# Patient Record
Sex: Female | Born: 1935 | Race: White | Hispanic: No | Smoking: Former smoker
Health system: Southern US, Community
[De-identification: ages and names within clinical notes are randomized; demographics above are authoritative.]

## PROBLEM LIST (undated history)

## (undated) DIAGNOSIS — I6529 Occlusion and stenosis of unspecified carotid artery: Secondary | ICD-10-CM

## (undated) DIAGNOSIS — A809 Acute poliomyelitis, unspecified: Secondary | ICD-10-CM

## (undated) DIAGNOSIS — I350 Nonrheumatic aortic (valve) stenosis: Secondary | ICD-10-CM

## (undated) DIAGNOSIS — E039 Hypothyroidism, unspecified: Secondary | ICD-10-CM

## (undated) DIAGNOSIS — E7800 Pure hypercholesterolemia, unspecified: Secondary | ICD-10-CM

## (undated) DIAGNOSIS — M1990 Unspecified osteoarthritis, unspecified site: Secondary | ICD-10-CM

## (undated) DIAGNOSIS — I2510 Atherosclerotic heart disease of native coronary artery without angina pectoris: Secondary | ICD-10-CM

## (undated) DIAGNOSIS — I8390 Asymptomatic varicose veins of unspecified lower extremity: Secondary | ICD-10-CM

## (undated) DIAGNOSIS — D519 Vitamin B12 deficiency anemia, unspecified: Secondary | ICD-10-CM

## (undated) DIAGNOSIS — M4800 Spinal stenosis, site unspecified: Secondary | ICD-10-CM

## (undated) DIAGNOSIS — K219 Gastro-esophageal reflux disease without esophagitis: Secondary | ICD-10-CM

## (undated) DIAGNOSIS — N183 Chronic kidney disease, stage 3 unspecified: Secondary | ICD-10-CM

## (undated) HISTORY — PX: THYROIDECTOMY: SHX17

## (undated) HISTORY — PX: DILATION AND CURETTAGE OF UTERUS: SHX78

## (undated) HISTORY — PX: JOINT REPLACEMENT: SHX530

## (undated) HISTORY — PX: SHOULDER DEBRIDEMENT: SHX1052

## (undated) HISTORY — PX: CATARACT EXTRACTION, BILATERAL: SHX1313

## (undated) HISTORY — PX: CARDIAC CATHETERIZATION: SHX172

## (undated) HISTORY — PX: COLONOSCOPY: SHX174

---
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
## 1981-07-30 HISTORY — PX: CHOLECYSTECTOMY: SHX55

## 1988-07-30 HISTORY — PX: KNEE ARTHROSCOPY: SUR90

## 1997-07-30 HISTORY — PX: TOTAL HIP ARTHROPLASTY: SHX124

## 1998-07-30 HISTORY — PX: TOTAL KNEE ARTHROPLASTY: SHX125

## 2002-07-30 HISTORY — PX: BUNIONECTOMY: SHX129

## 2003-07-31 DIAGNOSIS — I2510 Atherosclerotic heart disease of native coronary artery without angina pectoris: Secondary | ICD-10-CM

## 2003-07-31 DIAGNOSIS — I6529 Occlusion and stenosis of unspecified carotid artery: Secondary | ICD-10-CM

## 2003-07-31 HISTORY — DX: Occlusion and stenosis of unspecified carotid artery: I65.29

## 2003-07-31 HISTORY — DX: Atherosclerotic heart disease of native coronary artery without angina pectoris: I25.10

## 2006-07-30 DIAGNOSIS — M4800 Spinal stenosis, site unspecified: Secondary | ICD-10-CM

## 2006-07-30 HISTORY — DX: Spinal stenosis, site unspecified: M48.00

## 2007-10-29 HISTORY — PX: BELPHAROPTOSIS REPAIR: SHX369

## 2009-09-24 ENCOUNTER — Emergency Department (HOSPITAL_COMMUNITY): Admission: EM | Admit: 2009-09-24 | Discharge: 2009-09-24 | Payer: Self-pay | Admitting: Family Medicine

## 2009-09-27 ENCOUNTER — Inpatient Hospital Stay (HOSPITAL_COMMUNITY): Admission: EM | Admit: 2009-09-27 | Discharge: 2009-09-29 | Payer: Self-pay | Admitting: Emergency Medicine

## 2010-10-09 ENCOUNTER — Other Ambulatory Visit: Payer: Self-pay | Admitting: Family Medicine

## 2010-10-09 DIAGNOSIS — Z1231 Encounter for screening mammogram for malignant neoplasm of breast: Secondary | ICD-10-CM

## 2010-10-19 LAB — URINALYSIS, ROUTINE W REFLEX MICROSCOPIC
Glucose, UA: NEGATIVE mg/dL
Hgb urine dipstick: NEGATIVE
Nitrite: NEGATIVE
Protein, ur: NEGATIVE mg/dL

## 2010-10-19 LAB — CBC
HCT: 37.1 % (ref 36.0–46.0)
Hemoglobin: 12.7 g/dL (ref 12.0–15.0)
MCHC: 34.2 g/dL (ref 30.0–36.0)
MCV: 99.7 fL (ref 78.0–100.0)
Platelets: 146 10*3/uL — ABNORMAL LOW (ref 150–400)
RBC: 3.72 MIL/uL — ABNORMAL LOW (ref 3.87–5.11)
RDW: 13.4 % (ref 11.5–15.5)
WBC: 8 K/uL (ref 4.0–10.5)

## 2010-10-19 LAB — COMPREHENSIVE METABOLIC PANEL
ALT: 21 U/L (ref 0–35)
AST: 30 U/L (ref 0–37)
Albumin: 3.3 g/dL — ABNORMAL LOW (ref 3.5–5.2)
BUN: 14 mg/dL (ref 6–23)
Creatinine, Ser: 1.09 mg/dL (ref 0.4–1.2)
GFR calc Af Amer: 57 mL/min — ABNORMAL LOW (ref >60)
GFR calc non Af Amer: 47 mL/min — ABNORMAL LOW (ref >60)
Glucose, Bld: 92 mg/dL (ref 70–99)
Total Protein: 6.8 g/dL (ref 6.0–8.3)

## 2010-10-19 LAB — COMPREHENSIVE METABOLIC PANEL WITH GFR
Alkaline Phosphatase: 63 U/L (ref 39–117)
CO2: 26 meq/L (ref 19–32)
Calcium: 8.7 mg/dL (ref 8.4–10.5)
Chloride: 98 meq/L (ref 96–112)
Potassium: 3.2 meq/L — ABNORMAL LOW (ref 3.5–5.1)
Sodium: 135 meq/L (ref 135–145)
Total Bilirubin: 0.7 mg/dL (ref 0.3–1.2)

## 2010-10-19 LAB — DIFFERENTIAL
Basophils Absolute: 0 K/uL (ref 0.0–0.1)
Basophils Relative: 0 % (ref 0–1)
Eosinophils Absolute: 0 10*3/uL (ref 0.0–0.7)
Eosinophils Relative: 0 % (ref 0–5)
Lymphocytes Relative: 41 % (ref 12–46)
Lymphs Abs: 3.3 K/uL (ref 0.7–4.0)
Monocytes Absolute: 0.8 10*3/uL (ref 0.1–1.0)
Monocytes Relative: 10 % (ref 3–12)
Neutro Abs: 3.8 10*3/uL (ref 1.7–7.7)
Neutrophils Relative %: 48 % (ref 43–77)

## 2010-10-19 LAB — BRAIN NATRIURETIC PEPTIDE: Pro B Natriuretic peptide (BNP): 51.6 pg/mL (ref 0.0–100.0)

## 2010-10-19 LAB — URINE CULTURE
Colony Count: NO GROWTH
Special Requests: NEGATIVE

## 2010-10-19 LAB — EXPECTORATED SPUTUM ASSESSMENT W GRAM STAIN, RFLX TO RESP C

## 2010-10-19 LAB — TSH: TSH: 3.361 u[IU]/mL (ref 0.350–4.500)

## 2010-10-23 LAB — CULTURE, RESPIRATORY W GRAM STAIN: Gram Stain: NONE SEEN

## 2010-10-23 LAB — BASIC METABOLIC PANEL
CO2: 23 mEq/L (ref 19–32)
Chloride: 109 mEq/L (ref 96–112)
GFR calc Af Amer: >60 mL/min (ref >60)
Potassium: 4.2 mEq/L (ref 3.5–5.1)
Sodium: 136 mEq/L (ref 135–145)

## 2010-10-23 LAB — CBC
HCT: 35.5 % — ABNORMAL LOW (ref 36.0–46.0)
Hemoglobin: 11.9 g/dL — ABNORMAL LOW (ref 12.0–15.0)
MCHC: 33.6 g/dL (ref 30.0–36.0)
MCV: 100.8 fL — ABNORMAL HIGH (ref 78.0–100.0)

## 2010-11-16 ENCOUNTER — Ambulatory Visit
Admission: RE | Admit: 2010-11-16 | Discharge: 2010-11-16 | Disposition: A | Discharge status: Home / Self Care | Payer: Medicare Other | Source: Ambulatory Visit | Attending: Family Medicine | Admitting: Family Medicine

## 2010-11-16 ENCOUNTER — Ambulatory Visit: Payer: Self-pay

## 2010-11-16 DIAGNOSIS — Z1231 Encounter for screening mammogram for malignant neoplasm of breast: Secondary | ICD-10-CM

## 2011-05-28 ENCOUNTER — Encounter: Payer: Self-pay | Admitting: Cardiology

## 2011-07-01 ENCOUNTER — Emergency Department (HOSPITAL_COMMUNITY): Payer: Medicare Other

## 2011-07-01 ENCOUNTER — Inpatient Hospital Stay (HOSPITAL_COMMUNITY)
Admission: EM | Admit: 2011-07-01 | Discharge: 2011-07-04 | DRG: 282 | Disposition: A | Discharge status: Home / Self Care | Payer: Medicare Other | Attending: Cardiology | Admitting: Cardiology

## 2011-07-01 DIAGNOSIS — I2510 Atherosclerotic heart disease of native coronary artery without angina pectoris: Secondary | ICD-10-CM | POA: Diagnosis present

## 2011-07-01 DIAGNOSIS — M4800 Spinal stenosis, site unspecified: Secondary | ICD-10-CM | POA: Diagnosis present

## 2011-07-01 DIAGNOSIS — E039 Hypothyroidism, unspecified: Secondary | ICD-10-CM | POA: Diagnosis present

## 2011-07-01 DIAGNOSIS — Z7982 Long term (current) use of aspirin: Secondary | ICD-10-CM

## 2011-07-01 DIAGNOSIS — R7989 Other specified abnormal findings of blood chemistry: Secondary | ICD-10-CM

## 2011-07-01 DIAGNOSIS — Z96659 Presence of unspecified artificial knee joint: Secondary | ICD-10-CM

## 2011-07-01 DIAGNOSIS — Z886 Allergy status to analgesic agent status: Secondary | ICD-10-CM

## 2011-07-01 DIAGNOSIS — R002 Palpitations: Secondary | ICD-10-CM

## 2011-07-01 DIAGNOSIS — I10 Essential (primary) hypertension: Secondary | ICD-10-CM | POA: Diagnosis present

## 2011-07-01 DIAGNOSIS — I2582 Chronic total occlusion of coronary artery: Secondary | ICD-10-CM | POA: Diagnosis present

## 2011-07-01 DIAGNOSIS — R0602 Shortness of breath: Secondary | ICD-10-CM

## 2011-07-01 DIAGNOSIS — Z79899 Other long term (current) drug therapy: Secondary | ICD-10-CM

## 2011-07-01 DIAGNOSIS — E785 Hyperlipidemia, unspecified: Secondary | ICD-10-CM | POA: Diagnosis present

## 2011-07-01 DIAGNOSIS — I214 Non-ST elevation (NSTEMI) myocardial infarction: Secondary | ICD-10-CM | POA: Diagnosis not present

## 2011-07-01 DIAGNOSIS — R748 Abnormal levels of other serum enzymes: Secondary | ICD-10-CM

## 2011-07-01 DIAGNOSIS — Z8612 Personal history of poliomyelitis: Secondary | ICD-10-CM

## 2011-07-01 HISTORY — DX: Acute poliomyelitis, unspecified: A80.9

## 2011-07-01 HISTORY — DX: Hypothyroidism, unspecified: E03.9

## 2011-07-01 HISTORY — DX: Spinal stenosis, site unspecified: M48.00

## 2011-07-01 LAB — DIFFERENTIAL
Basophils Absolute: 0.1 10*3/uL (ref 0.0–0.1)
Basophils Relative: 1 % (ref 0–1)
Eosinophils Absolute: 0.2 10*3/uL (ref 0.0–0.7)
Eosinophils Relative: 2 % (ref 0–5)
Lymphocytes Relative: 35 % (ref 12–46)
Lymphs Abs: 3.1 10*3/uL (ref 0.7–4.0)
Monocytes Absolute: 1 10*3/uL (ref 0.1–1.0)

## 2011-07-01 LAB — URINALYSIS, ROUTINE W REFLEX MICROSCOPIC
Hgb urine dipstick: NEGATIVE
Leukocytes, UA: NEGATIVE
Nitrite: NEGATIVE
Urobilinogen, UA: 0.2 mg/dL (ref 0.0–1.0)

## 2011-07-01 LAB — CBC
HCT: 40.4 % (ref 36.0–46.0)
Hemoglobin: 13.3 g/dL (ref 12.0–15.0)
MCV: 103.6 fL — ABNORMAL HIGH (ref 78.0–100.0)
RBC: 3.9 MIL/uL (ref 3.87–5.11)
RDW: 12.5 % (ref 11.5–15.5)
WBC: 8.8 10*3/uL (ref 4.0–10.5)

## 2011-07-01 LAB — BASIC METABOLIC PANEL
CO2: 27 mEq/L (ref 19–32)
Calcium: 10 mg/dL (ref 8.4–10.5)
Creatinine, Ser: 0.77 mg/dL (ref 0.50–1.10)

## 2011-07-01 LAB — TROPONIN I: Troponin I: 2.91 ng/mL (ref <0.30)

## 2011-07-01 MED ORDER — SODIUM CHLORIDE 0.9 % IV SOLN
Freq: Once | INTRAVENOUS | Status: AC
  Administered 2011-07-01: 23:00:00 via INTRAVENOUS

## 2011-07-01 MED ORDER — HEPARIN (PORCINE) IN NACL 100-0.45 UNIT/ML-% IJ SOLN
12.0000 [IU]/kg/h | INTRAMUSCULAR | Status: DC
  Administered 2011-07-01: 12 [IU]/kg/h via INTRAVENOUS
  Filled 2011-07-01 (×2): qty 250

## 2011-07-01 MED ORDER — ASPIRIN 81 MG PO CHEW
324.0000 mg | CHEWABLE_TABLET | Freq: Once | ORAL | Status: AC
  Administered 2011-07-01: 324 mg via ORAL
  Filled 2011-07-01: qty 4

## 2011-07-01 MED ORDER — NITROGLYCERIN 2 % TD OINT
1.0000 [in_us] | TOPICAL_OINTMENT | Freq: Once | TRANSDERMAL | Status: AC
  Administered 2011-07-01: 1 [in_us] via TOPICAL
  Filled 2011-07-01: qty 30

## 2011-07-01 MED ORDER — HEPARIN BOLUS VIA INFUSION
4000.0000 [IU] | Freq: Once | INTRAVENOUS | Status: AC
  Administered 2011-07-01: 4000 [IU] via INTRAVENOUS
  Filled 2011-07-01: qty 4000

## 2011-07-01 NOTE — ED Notes (Signed)
Pt reports being very sad this time of year and she got to thinking about her husband and stated, "I felt a little pain and flutter in my chest" NSR on monitor with PACs. Rate 76. Pt is alert and oriented, good perfusion.

## 2011-07-01 NOTE — ED Notes (Signed)
EDP Lynelle Doctor made aware of critical troponin 2.91- no orders given

## 2011-07-01 NOTE — ED Notes (Signed)
Pt in from home with stated palpations dizziness and weakness denies pain/nausea states acute onset was told by ems to come in

## 2011-07-01 NOTE — ED Notes (Signed)
Patient transported to X-ray 

## 2011-07-01 NOTE — ED Provider Notes (Addendum)
History     CSN: [id number] Arrival date & time: 07/01/2011  6:32 PM   First MD Initiated Contact with Patient 07/01/11 1907      Chief Complaint  Patient presents with  . Irregular Heart Beat    HPI Pt this afternoon felt lightheaded as if she was faint.  Pt felt like her heart was racing.  She had been emotionally stressed since her husband passed away this time of year.  Over the past few weeks she has been not exercising as much.  She has been feeling slightly tired and sluggish.  She has not had any chest pain, abdominal pain, vomiting or diarrhea.  No fevers or cough.  She did take her toprol medication since she has been here.  The symptoms have resolved and now she feels fine. History reviewed. No pertinent past medical history.  Past Surgical History  Procedure Date  . Joint replacement   . Shoulder debridement   . Tonsillectomy   . Appendectomy     History reviewed. No pertinent family history.  History  Substance Use Topics  . Smoking status: Never Smoker   . Smokeless tobacco: Not on file  . Alcohol Use: No    OB History    Grav Para Term Preterm Abortions TAB SAB Ect Mult Living                  Review of Systems  All other systems reviewed and are negative.    Allergies  Review of patient's allergies indicates no known allergies.  Home Medications  No current outpatient prescriptions on file.  BP 126/72  Pulse 78  Temp(Src) 98.8 F (37.1 C) (Oral)  Resp 20  SpO2 99%  Physical Exam  Nursing note and vitals reviewed. Constitutional: She appears well-developed and well-nourished. No distress.  HENT:  Head: Normocephalic and atraumatic.  Right Ear: External ear normal.  Left Ear: External ear normal.  Eyes: Conjunctivae are normal. Right eye exhibits no discharge. Left eye exhibits no discharge. No scleral icterus.  Neck: Neck supple. No tracheal deviation present.  Cardiovascular: Normal rate, regular rhythm and intact distal pulses.     Pulmonary/Chest: Effort normal and breath sounds normal. No stridor. No respiratory distress. She has no wheezes. She has no rales.  Abdominal: Soft. Bowel sounds are normal. She exhibits no distension. There is no tenderness. There is no rebound and no guarding.  Musculoskeletal: She exhibits no edema and no tenderness.  Neurological: She is alert. She has normal strength. No sensory deficit. Cranial nerve deficit:  no gross defecits noted. She exhibits normal muscle tone. She displays no seizure activity. Coordination normal.       5/5 strength upper and lower extremity, nl speech  Skin: Skin is warm and dry. No rash noted.  Psychiatric: She has a normal mood and affect.    ED Course  Procedures (including critical care time)  Date: 07/01/2011  Rate: 84  Rhythm: normal sinus rhythm  QRS Axis: normal  Intervals: normal  ST/T Wave abnormalities: normal  Conduction Disutrbances:none  Narrative Interpretation:   Old EKG Reviewed: none available   Medications  vitamin B-12 (CYANOCOBALAMIN) 1000 MCG tablet (not administered)  acetaminophen (TYLENOL) 650 MG CR tablet (not administered)  furosemide (LASIX) 20 MG tablet (not administered)  multivitamin-iron-minerals-folic acid (CENTRUM) chewable tablet (not administered)  aspirin 81 MG chewable tablet (not administered)  famotidine (PEPCID) 20 MG tablet (not administered)  fexofenadine (ALLEGRA) 60 MG tablet (not administered)  gelatin 650 MG capsule (not  administered)  B Complex-C (B-COMPLEX WITH VITAMIN C) tablet (not administered)  cholecalciferol (VITAMIN D) 1000 UNITS tablet (not administered)  vitamin E 100 UNIT capsule (not administered)  metoprolol (TOPROL-XL) 50 MG 24 hr tablet (not administered)  benazepril (LOTENSIN) 10 MG tablet (not administered)  potassium chloride (KLOR-CON) 10 MEQ CR tablet (not administered)  levothyroxine (SYNTHROID, LEVOTHROID) 88 MCG tablet (not administered)  Misc Natural Products (OSTEO BI-FLEX  JOINT SHIELD) TABS (not administered)  aspirin chewable tablet 324 mg (not administered)  nitroGLYCERIN (NITROGLYN) 2 % ointment 1 inch (not administered)    Labs Reviewed  CBC - Abnormal; Notable for the following:    MCV 103.6 (*)    MCH 34.1 (*)    All other components within normal limits  BASIC METABOLIC PANEL - Abnormal; Notable for the following:    GFR calc non Af Amer 72 (*)    GFR calc Af Amer 83 (*)    All other components within normal limits  TROPONIN I - Abnormal; Notable for the following:    Troponin I 2.91 (*)    All other components within normal limits  DIFFERENTIAL  URINALYSIS, ROUTINE W REFLEX MICROSCOPIC   Dg Chest 2 View  07/01/2011  *RADIOLOGY REPORT*  Clinical Data: Palpitations and shortness of breath.  CHEST - 2 VIEW  Comparison: Chest x-ray 09/26/2009.  Findings: The heart is borderline enlarged but stable.  There is tortuosity and calcification of the thoracic aorta.  The lungs are clear.  No pleural effusion.  No pneumothorax.  The bony thorax is intact.  IMPRESSION: No acute cardiopulmonary findings.  Original Report Authenticated By: P. Loralie Champagne, M.D.    Diagnoses: Weakness, elevated cardiac enzymes, palpitations  MDM  Patient had an episode of palpitations associated with weakness and dizziness. Patient denied any chest pain or shortness of breath. Her EKG is unremarkable however she does have an elevated troponin level. This is certainly an atypical presentation but certainly could have been a manifestation of angina. I will consult cardiology for admission and further evaluation for possible nstemi   Celene Kras, MD 07/01/11 2125  Celene Kras, MD 07/01/11 2340

## 2011-07-01 NOTE — H&P (Signed)
Cardiology H&P  Primary Care Povider: Dr. Laurine Blazer [facility] Primary Cardiologist: previously Dr. Avel Sensor in [place]  HPI: Ms. Bannister is a 77 y.o.female with known CAD and LHC in 2005 with occluded RCA per her report who is brought to the ED by her granddaughter due to DOE.  She is normally very active and lives with minimal assistance in an assisted living apartment.  She was walking from her apartment to the garage and became very SOB.  She also felt palpitations and light headedness but did not report any chest discomfort.  Her granddaughter called EMS.  She is now feeling better.  She reports having been emotional for the last few days due to the anniversary of her husbands death.  She has never had symptoms similar to today.  Currently she is chest pain free.   Past Medical History  Diagnosis Date  . Essential hypertension   . Coronary atherosclerosis 2005    Followed by Dr. Avel Sensor in [place], [place] previously.  Had LHC with occluded RCA in 2005 per patient report  . Pneumonia 2011  . Heart murmur   . Spinal stenosis 2008  . Polio     Past Surgical History  Procedure Date  . Joint replacement   . Shoulder debridement   . Tonsillectomy   . Appendectomy   . Cholecystectomy   . Total knee arthroplasty     Family History  Problem Relation Age of Onset  . Coronary artery disease Father   . Coronary artery disease Brother   . Coronary artery disease Brother     Social History:  reports that she quit smoking about 48 years ago. Her smoking use included Cigarettes. She has never used smokeless tobacco. She reports that she drinks about 7 ounces of alcohol per week. She reports that she does not use illicit drugs.  Allergies:  Allergies  Allergen Reactions  . Codeine Other (See Comments)    UNKNOWN    Current Facility-Administered Medications  Medication Dose Route Frequency Provider Last Rate Last Dose  . aspirin chewable tablet 324 mg  324 mg  Oral Once Celene Kras, MD      . heparin 100 units/mL bolus via infusion 4,000 Units  4,000 Units Intravenous Once Celene Kras, MD      . heparin ADULT infusion 100 units/mL (25000 units/250 mL)  12 Units/kg/hr Intravenous Continuous Celene Kras, MD      . nitroGLYCERIN (NITROGLYN) 2 % ointment 1 inch  1 inch Topical Once Celene Kras, MD       Current Outpatient Prescriptions  Medication Sig Dispense Refill  . acetaminophen (TYLENOL) 650 MG CR tablet Take 1,300 mg by mouth 2 (two) times daily as needed. PAIN        . aspirin 81 MG chewable tablet Chew 81 mg by mouth daily.        . B Complex-C (B-COMPLEX WITH VITAMIN C) tablet Take 1 tablet by mouth daily.        . benazepril (LOTENSIN) 10 MG tablet Take 10 mg by mouth daily.        . cholecalciferol (VITAMIN D) 1000 UNITS tablet Take 1,000 Units by mouth daily.        . famotidine (PEPCID) 20 MG tablet Take 20 mg by mouth 2 (two) times daily.        . fexofenadine (ALLEGRA) 60 MG tablet Take 60 mg by mouth 2 (two) times daily.        Marland Kitchen  furosemide (LASIX) 20 MG tablet Take 20 mg by mouth 2 (two) times daily.        Marland Kitchen gelatin 650 MG capsule Take 650 mg by mouth daily.        Marland Kitchen levothyroxine (SYNTHROID, LEVOTHROID) 88 MCG tablet Take 88 mcg by mouth daily.        . metoprolol (TOPROL-XL) 50 MG 24 hr tablet Take 50 mg by mouth daily.        . Misc Natural Products (OSTEO BI-FLEX JOINT SHIELD) TABS Take 1 tablet by mouth daily.        . multivitamin-iron-minerals-folic acid (CENTRUM) chewable tablet Chew 1 tablet by mouth daily.        . potassium chloride (KLOR-CON) 10 MEQ CR tablet Take 10-20 mEq by mouth daily.        . vitamin B-12 (CYANOCOBALAMIN) 1000 MCG tablet Take 1,000 mcg by mouth daily.        . vitamin E 100 UNIT capsule Take 100 Units by mouth daily.          ROS: A full review of systems is obtained and is negative except as noted in the HPI.  Physical Exam: Blood pressure 126/72, pulse 78, temperature 98.8 F (37.1 C),  temperature source Oral, resp. rate 20, SpO2 99.00%.  GENERAL: no acute distress.  EYES: Extra ocular movements are intact. There is no lid lag. Sclera is anicteric.  ENT: Oropharynx is clear. Dentition is within normal limits.  NECK: Supple. The thyroid is not enlarged.  LYMPH: There are no masses or lymphadenopathy present.  HEART: Regular rate and rhythm with 2/6 SEM that increased with valvsalva, no JVD LUNGS: Clear to auscultation There are no rales, rhonchi, or wheezes.  ABDOMEN: Soft, non-tender, and non-distended with normoactive bowel sounds. There is no hepatosplenomegaly.  EXTREMITIES: No clubbing, cyanosis, minmal pedal edema PULSES:  DP/PT pulses were +2 and equal bilaterally.  SKIN: Warm, dry, and intact.  NEUROLOGIC: The patient was oriented to person, place, and time. No overt neurologic deficits were detected.  PSYCH: Normal judgment and insight, mood is appropriate.   Results: Lab Results  Component Value Date   WBC 8.8 07/01/2011   HGB 13.3 07/01/2011   HCT 40.4 07/01/2011   MCV 103.6* 07/01/2011   PLT 236 07/01/2011    Lab Results  Component Value Date   CREATININE 0.77 07/01/2011   BUN 14 07/01/2011   NA 137 07/01/2011   K 4.5 07/01/2011   CL 101 07/01/2011   CO2 27 07/01/2011    Lab Results  Component Value Date   TROPONINI 2.91* 07/01/2011    CXR: clear with no acute findings  EKG: NSR without STT changes  Assessment/Plan: 77 yo WF with HTN and known CAD here with exertional SOB and elevated troponin.  Diagnosis likely ACS/NSTEMI but would also consider stress cardiomyopathy/takotsubo given her recent emotional stress. She is interested in invasive approach if that is what is needed but she does not want surgery or to undergo general anesthesia under any circumstance 1. NSTEMI: - ASA 325 - heparin ggt - continue toprol xl, start statin - will consider invasive vs conservative approach  2. Murmur: suspect mild AS vs outflow tract obstruction, preserved  2nd heart sound. - check echo in AM   Satine Hausner 07/01/2011, 10:35 PM

## 2011-07-02 ENCOUNTER — Encounter (HOSPITAL_COMMUNITY): Payer: Self-pay | Admitting: *Deleted

## 2011-07-02 ENCOUNTER — Encounter (HOSPITAL_COMMUNITY)
Admission: EM | Disposition: A | Discharge status: Home / Self Care | Payer: Self-pay | Source: Home / Self Care | Attending: Internal Medicine

## 2011-07-02 DIAGNOSIS — I214 Non-ST elevation (NSTEMI) myocardial infarction: Principal | ICD-10-CM

## 2011-07-02 HISTORY — PX: LEFT HEART CATHETERIZATION WITH CORONARY ANGIOGRAM: SHX5451

## 2011-07-02 LAB — BASIC METABOLIC PANEL
BUN: 11 mg/dL (ref 6–23)
CO2: 26 mEq/L (ref 19–32)
Chloride: 104 mEq/L (ref 96–112)
Creatinine, Ser: 0.71 mg/dL (ref 0.50–1.10)

## 2011-07-02 LAB — CBC
HCT: 36.4 % (ref 36.0–46.0)
HCT: 35.6 % — ABNORMAL LOW (ref 36.0–46.0)
MCHC: 31.9 g/dL (ref 30.0–36.0)
MCV: 103.5 fL — ABNORMAL HIGH (ref 78.0–100.0)
Platelets: 211 10*3/uL (ref 150–400)
RBC: 3.44 MIL/uL — ABNORMAL LOW (ref 3.87–5.11)
RDW: 12.5 % (ref 11.5–15.5)
WBC: 6.8 10*3/uL (ref 4.0–10.5)
WBC: 6.3 10*3/uL (ref 4.0–10.5)

## 2011-07-02 LAB — LIPID PANEL
HDL: 59 mg/dL (ref >39)
LDL Cholesterol: 102 mg/dL — ABNORMAL HIGH (ref 0–99)
Triglycerides: 93 mg/dL (ref <150)
VLDL: 19 mg/dL (ref 0–40)

## 2011-07-02 LAB — COMPREHENSIVE METABOLIC PANEL
ALT: 12 U/L (ref 0–35)
Albumin: 3.3 g/dL — ABNORMAL LOW (ref 3.5–5.2)
Alkaline Phosphatase: 80 U/L (ref 39–117)
BUN: 12 mg/dL (ref 6–23)
Chloride: 106 mEq/L (ref 96–112)
GFR calc Af Amer: 85 mL/min — ABNORMAL LOW (ref >90)
Glucose, Bld: 99 mg/dL (ref 70–99)
Potassium: 3.6 mEq/L (ref 3.5–5.1)
Sodium: 141 mEq/L (ref 135–145)
Total Bilirubin: 0.7 mg/dL (ref 0.3–1.2)

## 2011-07-02 LAB — CARDIAC PANEL(CRET KIN+CKTOT+MB+TROPI)
CK, MB: 11.9 ng/mL (ref 0.3–4.0)
CK, MB: 9.6 ng/mL (ref 0.3–4.0)
Relative Index: 8.7 — ABNORMAL HIGH (ref 0.0–2.5)
Total CK: 127 U/L (ref 7–177)
Troponin I: 2.92 ng/mL (ref <0.30)
Troponin I: 2.3 ng/mL (ref <0.30)

## 2011-07-02 LAB — HEPARIN LEVEL (UNFRACTIONATED): Heparin Unfractionated: 0.46 IU/mL (ref 0.30–0.70)

## 2011-07-02 LAB — HEMOGLOBIN A1C: Hgb A1c MFr Bld: 6 % — ABNORMAL HIGH (ref <5.7)

## 2011-07-02 LAB — MRSA PCR SCREENING: MRSA by PCR: NEGATIVE

## 2011-07-02 SURGERY — LEFT HEART CATHETERIZATION WITH CORONARY ANGIOGRAM
Anesthesia: LOCAL

## 2011-07-02 MED ORDER — SODIUM CHLORIDE 0.9 % IV SOLN
INTRAVENOUS | Status: DC
  Administered 2011-07-02: 04:00:00 via INTRAVENOUS

## 2011-07-02 MED ORDER — LORATADINE 10 MG PO TABS
10.0000 mg | ORAL_TABLET | Freq: Every day | ORAL | Status: DC
  Administered 2011-07-02 – 2011-07-04 (×3): 10 mg via ORAL
  Filled 2011-07-02 (×3): qty 1

## 2011-07-02 MED ORDER — FAMOTIDINE 20 MG PO TABS
20.0000 mg | ORAL_TABLET | Freq: Two times a day (BID) | ORAL | Status: DC
  Administered 2011-07-02 – 2011-07-04 (×5): 20 mg via ORAL
  Filled 2011-07-02 (×6): qty 1

## 2011-07-02 MED ORDER — CENTRUM PO CHEW: 1.0000 | CHEWABLE_TABLET | Freq: Every day | ORAL | Status: DC

## 2011-07-02 MED ORDER — ACETAMINOPHEN 325 MG PO TABS: 650.0000 mg | ORAL_TABLET | ORAL | Status: DC | PRN

## 2011-07-02 MED ORDER — SODIUM CHLORIDE 0.9 % IV SOLN
INTRAVENOUS | Status: DC
  Administered 2011-07-02 – 2011-07-03 (×3): via INTRAVENOUS

## 2011-07-02 MED ORDER — MIDAZOLAM HCL 2 MG/2ML IJ SOLN
INTRAMUSCULAR | Status: AC
  Filled 2011-07-02: qty 2

## 2011-07-02 MED ORDER — HEPARIN (PORCINE) IN NACL 2-0.9 UNIT/ML-% IJ SOLN
INTRAMUSCULAR | Status: AC
  Filled 2011-07-02: qty 2000

## 2011-07-02 MED ORDER — VITAMIN D3 25 MCG (1000 UNIT) PO TABS
1000.0000 [IU] | ORAL_TABLET | Freq: Every day | ORAL | Status: DC
  Administered 2011-07-02 – 2011-07-04 (×3): 1000 [IU] via ORAL
  Filled 2011-07-02 (×3): qty 1

## 2011-07-02 MED ORDER — BENAZEPRIL HCL 10 MG PO TABS
10.0000 mg | ORAL_TABLET | Freq: Every day | ORAL | Status: DC
  Administered 2011-07-02 – 2011-07-03 (×2): 10 mg via ORAL
  Filled 2011-07-02 (×3): qty 1

## 2011-07-02 MED ORDER — ASPIRIN 81 MG PO CHEW
324.0000 mg | CHEWABLE_TABLET | ORAL | Status: DC
  Filled 2011-07-02: qty 4

## 2011-07-02 MED ORDER — LEVOTHYROXINE SODIUM 88 MCG PO TABS
88.0000 ug | ORAL_TABLET | Freq: Every day | ORAL | Status: DC
  Administered 2011-07-02 – 2011-07-04 (×3): 88 ug via ORAL
  Filled 2011-07-02 (×4): qty 1

## 2011-07-02 MED ORDER — THERA M PLUS PO TABS
1.0000 | ORAL_TABLET | Freq: Every day | ORAL | Status: DC
  Administered 2011-07-02 – 2011-07-04 (×3): 1 via ORAL
  Filled 2011-07-02 (×3): qty 1

## 2011-07-02 MED ORDER — DIAZEPAM 2 MG PO TABS
2.0000 mg | ORAL_TABLET | ORAL | Status: DC
  Filled 2011-07-02: qty 1

## 2011-07-02 MED ORDER — ONDANSETRON HCL 4 MG/2ML IJ SOLN: 4.0000 mg | Freq: Four times a day (QID) | INTRAMUSCULAR | Status: DC | PRN

## 2011-07-02 MED ORDER — B COMPLEX-C PO TABS
1.0000 | ORAL_TABLET | Freq: Every day | ORAL | Status: DC
  Administered 2011-07-02 – 2011-07-04 (×3): 1 via ORAL
  Filled 2011-07-02 (×3): qty 1

## 2011-07-02 MED ORDER — NITROGLYCERIN 0.2 MG/ML ON CALL CATH LAB
INTRAVENOUS | Status: AC
  Filled 2011-07-02: qty 1

## 2011-07-02 MED ORDER — SODIUM CHLORIDE 0.9 % IJ SOLN: 3.0000 mL | INTRAMUSCULAR | Status: DC | PRN

## 2011-07-02 MED ORDER — DIAZEPAM 2 MG PO TABS
2.0000 mg | ORAL_TABLET | ORAL | Status: AC
  Administered 2011-07-02: 2 mg via ORAL

## 2011-07-02 MED ORDER — VITAMIN E 45 MG (100 UNIT) PO CAPS
100.0000 [IU] | ORAL_CAPSULE | Freq: Every day | ORAL | Status: DC
  Administered 2011-07-02 – 2011-07-04 (×3): 100 [IU] via ORAL
  Filled 2011-07-02 (×3): qty 1

## 2011-07-02 MED ORDER — SODIUM CHLORIDE 0.9 % IJ SOLN
3.0000 mL | Freq: Two times a day (BID) | INTRAMUSCULAR | Status: DC
  Administered 2011-07-02: 3 mL via INTRAVENOUS

## 2011-07-02 MED ORDER — ASPIRIN 81 MG PO CHEW
324.0000 mg | CHEWABLE_TABLET | ORAL | Status: AC
  Administered 2011-07-02: 324 mg via ORAL

## 2011-07-02 MED ORDER — SODIUM CHLORIDE 0.9 % IV SOLN: 250.0000 mL | INTRAVENOUS | Status: DC | PRN

## 2011-07-02 MED ORDER — NITROGLYCERIN 0.4 MG SL SUBL: 0.4000 mg | SUBLINGUAL_TABLET | SUBLINGUAL | Status: DC | PRN

## 2011-07-02 MED ORDER — SODIUM CHLORIDE 0.9 % IV SOLN: 1.0000 mL/kg/h | INTRAVENOUS | Status: DC

## 2011-07-02 MED ORDER — FUROSEMIDE 20 MG PO TABS
20.0000 mg | ORAL_TABLET | Freq: Two times a day (BID) | ORAL | Status: DC
  Administered 2011-07-02: 20 mg via ORAL
  Filled 2011-07-02 (×3): qty 1

## 2011-07-02 MED ORDER — METOPROLOL SUCCINATE ER 50 MG PO TB24
50.0000 mg | ORAL_TABLET | Freq: Every day | ORAL | Status: DC
  Administered 2011-07-02 – 2011-07-04 (×3): 50 mg via ORAL
  Filled 2011-07-02 (×3): qty 1

## 2011-07-02 MED ORDER — HEPARIN SOD (PORCINE) IN D5W 100 UNIT/ML IV SOLN
1000.0000 [IU]/h | INTRAVENOUS | Status: DC
  Administered 2011-07-02: 1000 [IU]/h via INTRAVENOUS
  Filled 2011-07-02 (×3): qty 250

## 2011-07-02 MED ORDER — ASPIRIN EC 325 MG PO TBEC
325.0000 mg | DELAYED_RELEASE_TABLET | Freq: Every day | ORAL | Status: DC
  Administered 2011-07-02 – 2011-07-04 (×3): 325 mg via ORAL
  Filled 2011-07-02 (×3): qty 1

## 2011-07-02 MED ORDER — LIDOCAINE HCL (PF) 1 % IJ SOLN
INTRAMUSCULAR | Status: AC
  Filled 2011-07-02: qty 30

## 2011-07-02 MED ORDER — SIMVASTATIN 20 MG PO TABS
20.0000 mg | ORAL_TABLET | Freq: Every day | ORAL | Status: DC
  Administered 2011-07-02 – 2011-07-03 (×2): 20 mg via ORAL
  Filled 2011-07-02 (×3): qty 1

## 2011-07-02 NOTE — Interval H&P Note (Signed)
History and Physical Interval Note:  07/02/2011 1:53 PM  Shristi Scheib  has presented today for surgery, with the diagnosis of NSTEMI  The various methods of treatment have been discussed with the patient and family. After consideration of risks, benefits and other options for treatment, the patient has consented to  Procedure(s): LEFT HEART CATHETERIZATION WITH CORONARY ANGIOGRAM as a surgical intervention .  The patients' history has been reviewed, patient examined, no change in status, stable for surgery.  I have reviewed the patients' chart and labs.  Questions were answered to the patient's satisfaction.     Rollene Rotunda

## 2011-07-02 NOTE — H&P (View-Only) (Signed)
@   Subjective:  Denies CP or dyspnea   Objective:  Filed Vitals:   07/02/11 0400 07/02/11 0600 07/02/11 0700 07/02/11 0800  BP:  116/80 126/65 121/68  Pulse: 72 66 61 61  Temp: 98.2 F (36.8 C)     TempSrc: Oral     Resp:  15 16 18  Height:      Weight:      SpO2: 99% 97% 97% 98%    Intake/Output from previous day:  Intake/Output Summary (Last 24 hours) at 07/02/11 0942 Last data filed at 07/02/11 0800  Gross per 24 hour  Intake    192 ml  Output    350 ml  Net   -158 ml    Physical Exam: Physical exam: Well-developed well-nourished in no acute distress.  Skin is warm and dry.  HEENT is normal.  Neck is supple. No thyromegaly.  Chest is clear to auscultation with normal expansion.  Cardiovascular exam is regular rate and rhythm. 2/6 systolic murmur Abdominal exam nontender or distended. No masses palpated. Extremities show no edema. neuro grossly intact    Lab Results: Basic Metabolic Panel:  Basename 07/02/11 0720 07/01/11 2022  NA 141 137  K 3.6 4.5  CL 106 101  CO2 26 27  GLUCOSE 99 96  BUN 12 14  CREATININE 0.71 0.77  CALCIUM 9.1 10.0  MG -- --  PHOS -- --   Liver Function Tests:  Basename 07/02/11 0720  AST 30  ALT 12  ALKPHOS 80  BILITOT 0.7  PROT 6.3  ALBUMIN 3.3*   No results found for this basename: LIPASE:2,AMYLASE:2 in the last 72 hours CBC:  Basename 07/02/11 0720 07/01/11 2022  WBC 6.8 8.8  NEUTROABS -- 4.5  HGB 11.6* 13.3  HCT 36.4 40.4  MCV 103.4* 103.6*  PLT 219 236   Cardiac Enzymes:  Basename 07/02/11 0720 07/01/11 2022  CKTOTAL 137 --  CKMB 11.9* --  CKMBINDEX -- --  TROPONINI 2.92* 2.91*   Fasting Lipid Panel:  Basename 07/02/11 0720  CHOL 180  HDL 59  LDLCALC 102*  TRIG 93  CHOLHDL 3.1  LDLDIRECT --      Assessment/Plan:  1) NSTEMI - Patient very functional; plan cath/possible PCI. Risks and benefits discussed and patient agrees to proceed. Continue present meds; hold lasix 2) murmur - outpatient  echo. 3) HTN - continue present BP meds 4) Hyperlipidemia - Continue statin. 5) H/O CAD Brian Crenshaw 07/02/2011, 9:42 AM    

## 2011-07-02 NOTE — Progress Notes (Addendum)
ANTICOAGULATION CONSULT NOTE - Follow Up Consult  Pharmacy Consult for Hepairn Indication: chest pain/ACS  Allergies  Allergen Reactions  . Codeine Other (See Comments)    UNKNOWN    Patient Measurements: Height: 5' (152.4 cm) Weight: 188 lb 0.8 oz (85.3 kg) IBW/kg (Calculated) : 45.5  Adjusted Body Weight:   Vital Signs: Temp: 98.2 F (36.8 C) (12/03 1200) Temp src: Oral (12/03 1200) BP: 140/92 mmHg (12/03 1200) Pulse Rate: 63  (12/03 1000)  Labs:  Basename 07/02/11 1230 07/02/11 1147 07/02/11 0720 07/01/11 2022  HGB -- 11.3* 11.6* --  HCT -- 35.6* 36.4 40.4  PLT -- 211 219 236  APTT -- -- 111* --  LABPROT -- -- 14.4 --  INR -- -- 1.10 --  HEPARINUNFRC -- 0.46 -- --  CREATININE -- 0.71 0.71 0.77  CKTOTAL 127 -- 137 --  CKMB 9.6* -- 11.9* --  TROPONINI 2.30* -- 2.92* 2.91*   Estimated Creatinine Clearance: 45.3 ml/min (by C-G formula based on Cr of 0.71).   Medications:  Infusions:    . sodium chloride 20 mL/hr at 07/02/11 0412  . sodium chloride    . heparin 12 Units/kg/hr (07/01/11 2303)    Assessment: 89 yof admitted with chest pain currently at goal on IV heparin (0.4). No complications noted, plan for cath today. Goal of Therapy:  Heparin level 0.3-0.7 units/ml   Plan:  Continue heparin at current rate Follow up after cath.  Severiano Gilbert 07/02/2011,1:49 PM   P.m. Addendum  Patient is now s/p cath. There is concern for possible R groin hematoma, will restart heparin at reduced rate.   There has been some confusion regarding heparin dose. Rate was never decreased to 1000 units/hr overnight, per RN heparin was running at 1200 units/hr (9ml/hr) prior to cath. Plan: 1.F/u d-dimer/ruling out PE 2.Restart heparin tonight without bolus 3.Heparin level with am labs  Sheppard Coil PharmD

## 2011-07-02 NOTE — Procedures (Signed)
  Cardiac Catheterization Procedure Note  Name: Holly Gibson MRN: [id number] DOB: [birth year]  Procedure: Left Heart Cath, Selective Coronary Angiography, LV angiography  Indication:  NQWMI  Procedural details: The right groin was prepped, draped, and anesthetized with 1% lidocaine. Using modified Seldinger technique, a 5 French sheath was introduced into the right femoral artery. Standard Judkins catheters were used for coronary angiography and left ventriculography. Catheter exchanges were performed over a guidewire. There were no immediate procedural complications. The patient was transferred to the post catheterization recovery area for further monitoring.  Procedural Findings:  Hemodynamics:     AO 128/57     LV  157/8   Coronary angiography:  Coronary dominance: Right  Left mainstem:   Luminal irregulars  Left anterior descending (LAD):   Proximal and mid severe calcification.  Long proximal 25% stenosis. First diagonal very small normal. Second diagonal moderate size normal. There diagonal moderate size normal.  Left circumflex (LCx):  AV groove proximal moderate calcification with proximal luminal irregularities. There is a very large branching obtuse marginal with mild luminal irregularities.  Right coronary artery (RCA):  Right coronary is dominant. There is severe calcification. It is occluded in the midsegment. There is brisk collateralization predominantly from the LAD but also slightly from the circumflex.  Left ventriculography: Left ventricular systolic function is normal, LVEF is estimated at 65%, there is no significant mitral regurgitation.  There was an aortic valvular gradient noted. There was significant mitral annular calcification.  Final Conclusions:  High-grade single-vessel obstructive coronary disease which has been reported previously. Preserved ejection fraction. There is a peak to peak aortic gradient.  Recommendations: The patient will have a d-dimer.  Consideration will be given to further imaging to rule out pulmonary emboli if this is elevated. She will have an outpatient echocardiogram to evaluate the degree of aortic stenosis.  Rollene Rotunda 07/02/2011, 2:23 PM

## 2011-07-02 NOTE — Progress Notes (Signed)
TO THE CATH LAB AWAKE AND ALERT, STABLE.Marland Kitchen REPORT GIVEN.

## 2011-07-02 NOTE — Progress Notes (Signed)
ANTICOAGULATION CONSULT NOTE - Initial Consult  Pharmacy Consult for Heparin  Indication: NSTEMI  Allergies  Allergen Reactions  . Codeine Other (See Comments)    UNKNOWN    Patient Measurements: Height: 5' (152.4 cm) Weight: 188 lb 0.8 oz (85.3 kg) IBW/kg (Calculated) : 45.5    Vital Signs: Temp: 98.3 F (36.8 C) (12/03 0215) Temp src: Oral (12/03 0215) BP: 118/72 mmHg (12/03 0215) Pulse Rate: 68  (12/03 0215)  Labs:  [facility] 07/01/11 2022  HGB 13.3  HCT 40.4  PLT 236  APTT --  LABPROT --  INR --  HEPARINUNFRC --  CREATININE 0.77  CKTOTAL --  CKMB --  TROPONINI 2.91*   Estimated Creatinine Clearance: 45.3 ml/min (by C-G formula based on Cr of 0.77).  Medical History: Past Medical History  Diagnosis Date  . Essential hypertension   . Coronary atherosclerosis 2005    Followed by Dr. Avel Sensor in [place], [place] previously.  Had LHC with occluded RCA in 2005 per patient report  . Pneumonia 2011  . Heart murmur   . Spinal stenosis 2008  . Polio     Medications:  Prescriptions prior to admission  Medication Sig Dispense Refill  . acetaminophen (TYLENOL) 650 MG CR tablet Take 1,300 mg by mouth 2 (two) times daily as needed. PAIN        . aspirin 81 MG chewable tablet Chew 81 mg by mouth daily.        . B Complex-C (B-COMPLEX WITH VITAMIN C) tablet Take 1 tablet by mouth daily.        . benazepril (LOTENSIN) 10 MG tablet Take 10 mg by mouth daily.        . cholecalciferol (VITAMIN D) 1000 UNITS tablet Take 1,000 Units by mouth daily.        . famotidine (PEPCID) 20 MG tablet Take 20 mg by mouth 2 (two) times daily.        . fexofenadine (ALLEGRA) 60 MG tablet Take 60 mg by mouth 2 (two) times daily.        . furosemide (LASIX) 20 MG tablet Take 20 mg by mouth 2 (two) times daily.        Marland Kitchen gelatin 650 MG capsule Take 650 mg by mouth daily.        Marland Kitchen levothyroxine (SYNTHROID, LEVOTHROID) 88 MCG tablet Take 88 mcg by mouth daily.        . metoprolol  (TOPROL-XL) 50 MG 24 hr tablet Take 50 mg by mouth daily.        . Misc Natural Products (OSTEO BI-FLEX JOINT SHIELD) TABS Take 1 tablet by mouth daily.        . multivitamin-iron-minerals-folic acid (CENTRUM) chewable tablet Chew 1 tablet by mouth daily.        . potassium chloride (KLOR-CON) 10 MEQ CR tablet Take 10-20 mEq by mouth daily.        . vitamin B-12 (CYANOCOBALAMIN) 1000 MCG tablet Take 1,000 mcg by mouth daily.        . vitamin E 100 UNIT capsule Take 100 Units by mouth daily.          Assessment: 89 female with NSTEMI for chest pain.  Heparin currently infusing at 1200 units/hr upon transfer from [facility].  Goal of Therapy:  Heparin level 0.3-0.7 units/ml   Plan:  Decrease Heparin 1000 units/hr. Check 8 hr heparin level.   Orma Cheetham, Gary Fleet 07/02/2011,2:58 AM

## 2011-07-02 NOTE — Progress Notes (Signed)
BACK FROM THE CATH LAB AWAKE AND ALERT, RIGHT GROIN  WITH BLACKISH DISCOLORATION, MARKED WITH PEN , SOFT TO TOUCH, DENIED ANY DISCOMFORT, INSTRUCTED NOT TO BEND RIGHT KNEE, AND TO CALL FOR ANY SIGNS OF BLEEDING. CONTINUE TO MONITOR.

## 2011-07-02 NOTE — Progress Notes (Signed)
Poor vein access , ivt made aware to insert another piv for cardiac cath.

## 2011-07-02 NOTE — Progress Notes (Signed)
critcal value- troponin- 9.92/ ckmb-11.9, dr. Jens Som made aware.

## 2011-07-02 NOTE — Progress Notes (Signed)
@   Subjective:  Denies CP or dyspnea   Objective:  Filed Vitals:   07/02/11 0400 07/02/11 0600 07/02/11 0700 07/02/11 0800  BP:  116/80 126/65 121/68  Pulse: 72 66 61 61  Temp: 98.2 F (36.8 C)     TempSrc: Oral     Resp:  15 16 18   Height:      Weight:      SpO2: 99% 97% 97% 98%    Intake/Output from previous day:  Intake/Output Summary (Last 24 hours) at 07/02/11 0942 Last data filed at 07/02/11 0800  Gross per 24 hour  Intake    192 ml  Output    350 ml  Net   -158 ml    Physical Exam: Physical exam: Well-developed well-nourished in no acute distress.  Skin is warm and dry.  HEENT is normal.  Neck is supple. No thyromegaly.  Chest is clear to auscultation with normal expansion.  Cardiovascular exam is regular rate and rhythm. 2/6 systolic murmur Abdominal exam nontender or distended. No masses palpated. Extremities show no edema. neuro grossly intact    Lab Results: Basic Metabolic Panel:  Basename 07/02/11 0720 07/01/11 2022  NA 141 137  K 3.6 4.5  CL 106 101  CO2 26 27  GLUCOSE 99 96  BUN 12 14  CREATININE 0.71 0.77  CALCIUM 9.1 10.0  MG -- --  PHOS -- --   Liver Function Tests:  [facility] 07/02/11 0720  AST 30  ALT 12  ALKPHOS 80  BILITOT 0.7  PROT 6.3  ALBUMIN 3.3*   No results found for this basename: LIPASE:2,AMYLASE:2 in the last 72 hours CBC:  Basename 07/02/11 0720 07/01/11 2022  WBC 6.8 8.8  NEUTROABS -- 4.5  HGB 11.6* 13.3  HCT 36.4 40.4  MCV 103.4* 103.6*  PLT 219 236   Cardiac Enzymes:  Basename 07/02/11 0720 07/01/11 2022  CKTOTAL 137 --  CKMB 11.9* --  CKMBINDEX -- --  TROPONINI 2.92* 2.91*   Fasting Lipid Panel:  Basename 07/02/11 0720  CHOL 180  HDL 59  LDLCALC 102*  TRIG 93  CHOLHDL 3.1  LDLDIRECT --      Assessment/Plan:  1) NSTEMI - Patient very functional; plan cath/possible PCI. Risks and benefits discussed and patient agrees to proceed. Continue present meds; hold lasix 2) murmur - outpatient  echo. 3) HTN - continue present BP meds 4) Hyperlipidemia - Continue statin. 5) H/O CAD Olga Millers 07/02/2011, 9:42 AM

## 2011-07-03 ENCOUNTER — Inpatient Hospital Stay (HOSPITAL_COMMUNITY): Payer: Medicare Other

## 2011-07-03 DIAGNOSIS — I359 Nonrheumatic aortic valve disorder, unspecified: Secondary | ICD-10-CM

## 2011-07-03 LAB — BASIC METABOLIC PANEL
CO2: 25 mEq/L (ref 19–32)
Chloride: 103 mEq/L (ref 96–112)
Creatinine, Ser: 0.72 mg/dL (ref 0.50–1.10)
Sodium: 138 mEq/L (ref 135–145)

## 2011-07-03 LAB — CBC
Hemoglobin: 12.3 g/dL (ref 12.0–15.0)
MCV: 102.5 fL — ABNORMAL HIGH (ref 78.0–100.0)
Platelets: 208 10*3/uL (ref 150–400)
RBC: 3.65 MIL/uL — ABNORMAL LOW (ref 3.87–5.11)
WBC: 8.6 10*3/uL (ref 4.0–10.5)

## 2011-07-03 MED ORDER — XENON XE 133 GAS
10.0000 | GAS_FOR_INHALATION | Freq: Once | RESPIRATORY_TRACT | Status: AC | PRN
  Administered 2011-07-03: 10 via RESPIRATORY_TRACT

## 2011-07-03 MED ORDER — HEPARIN SODIUM (PORCINE) 5000 UNIT/ML IJ SOLN
5000.0000 [IU] | Freq: Three times a day (TID) | INTRAMUSCULAR | Status: DC
  Administered 2011-07-03 – 2011-07-04 (×2): 5000 [IU] via SUBCUTANEOUS
  Filled 2011-07-03 (×5): qty 1

## 2011-07-03 MED ORDER — TECHNETIUM TO 99M ALBUMIN AGGREGATED
6.0000 | Freq: Once | INTRAVENOUS | Status: AC | PRN
  Administered 2011-07-03: 6 via INTRAVENOUS

## 2011-07-03 MED ORDER — FUROSEMIDE 20 MG PO TABS
20.0000 mg | ORAL_TABLET | Freq: Two times a day (BID) | ORAL | Status: DC
  Administered 2011-07-03 – 2011-07-04 (×3): 20 mg via ORAL
  Filled 2011-07-03 (×5): qty 1

## 2011-07-03 NOTE — Progress Notes (Signed)
ANTICOAGULATION CONSULT NOTE - Follow Up Consult  Pharmacy Consult for Heparin Indication: r/o PE  Allergies  Allergen Reactions  . Codeine Other (See Comments)    UNKNOWN    Patient Measurements: Height: 5' (152.4 cm) Weight: 188 lb 0.8 oz (85.3 kg) IBW/kg (Calculated) : 45.5  Heparin Dosing Weight: 65.4 kg  Vital Signs: Temp: 98.4 F (36.9 C) (12/04 1200) Temp src: Oral (12/04 1200) BP: 112/65 mmHg (12/04 1140) Pulse Rate: 85  (12/04 0600)  Labs:  Basename 07/03/11 0550 07/02/11 1230 07/02/11 1147 07/02/11 0720 07/01/11 2022  HGB 12.3 -- 11.3* -- --  HCT 37.4 -- 35.6* 36.4 --  PLT 208 -- 211 219 --  APTT -- -- -- 111* --  LABPROT -- -- -- 14.4 --  INR -- -- -- 1.10 --  HEPARINUNFRC 0.44 -- 0.46 -- --  CREATININE 0.72 -- 0.71 0.71 --  CKTOTAL -- 127 -- 137 --  CKMB -- 9.6* -- 11.9* --  TROPONINI -- 2.30* -- 2.92* 2.91*   Estimated Creatinine Clearance: 45.3 ml/min (by C-G formula based on Cr of 0.72).   Medications:  Infusions:     . sodium chloride 15 mL/hr at 07/03/11 0552  . heparin 10 mL/hr (07/03/11 0800)  . DISCONTD: heparin 12 Units/kg/hr (07/01/11 2303)    Assessment: 77 y.o. F originally admitted with CP/NSTEMI however cath found previously known 1V CAD dz. Continuing on heparin now to r/o PE. Heparin level this a.m was therapeutic--the patient is to get a VQ scan this afternoon. Bruising on R groin noted per discussion with nurse however does not appear to be hematoma.  Goal of Therapy:  Heparin level 0.3-0.7 units/ml   Plan:  1. Continue heparin at current drip rate of 1000 units/hr (10 ml/hr) 2. Will continue to monitor for any signs/symptoms of bleeding and will follow up with heparin level in the a.m.   Rolley Sims, PharmD, BCPS 07/03/2011,2:48 PM

## 2011-07-03 NOTE — Progress Notes (Signed)
*  PRELIMINARY RESULTS* Echocardiogram 2D Echocardiogram has been performed.  Clide Deutscher RDCS 07/03/2011, 3:56 PM

## 2011-07-03 NOTE — Progress Notes (Signed)
@   Subjective:  Denies CP or dyspnea; states she was confused last PM.   Objective:  Filed Vitals:   07/02/11 1945 07/02/11 2000 07/02/11 2350 07/03/11 0350  BP: 105/55  119/63 127/82  Pulse: 76 76 77 64  Temp:  98.2 F (36.8 C) 98.2 F (36.8 C) 98 F (36.7 C)  TempSrc:  Oral Oral Oral  Resp: 19 17 20 14   Height:      Weight:      SpO2: 96% 97% 96% 97%    Intake/Output from previous day:  Intake/Output Summary (Last 24 hours) at 07/03/11 0641 Last data filed at 07/03/11 0400  Gross per 24 hour  Intake 464.67 ml  Output   1350 ml  Net -885.33 ml    Physical Exam: Physical exam: Well-developed well-nourished in no acute distress.  Skin is warm and dry.  HEENT is normal.  Neck is supple. No thyromegaly.  Chest is clear to auscultation with normal expansion.  Cardiovascular exam is regular rate and rhythm. 2/6 systolic murmur Abdominal exam nontender or distended. No masses palpated. Right groin with no hematoma or bruit Extremities show no edema. neuro grossly intact    Lab Results: Basic Metabolic Panel:  Basename 07/02/11 1147 07/02/11 0720  NA 140 141  K 3.8 3.6  CL 104 106  CO2 26 26  GLUCOSE 88 99  BUN 11 12  CREATININE 0.71 0.71  CALCIUM 9.3 9.1  MG -- --  PHOS -- --   Liver Function Tests:  [facility] 07/02/11 0720  AST 30  ALT 12  ALKPHOS 80  BILITOT 0.7  PROT 6.3  ALBUMIN 3.3*   No results found for this basename: LIPASE:2,AMYLASE:2 in the last 72 hours CBC:  Basename 07/03/11 0550 07/02/11 1147 07/01/11 2022  WBC 8.6 6.3 --  NEUTROABS -- -- 4.5  HGB 12.3 11.3* --  HCT 37.4 35.6* --  MCV 102.5* 103.5* --  PLT 208 211 --   Cardiac Enzymes:  Basename 07/02/11 1230 07/02/11 0720 07/01/11 2022  CKTOTAL 127 137 --  CKMB 9.6* 11.9* --  CKMBINDEX -- -- --  TROPONINI 2.30* 2.92* 2.91*   Fasting Lipid Panel:  Basename 07/02/11 0720  CHOL 180  HDL 59  LDLCALC 102*  TRIG 93  CHOLHDL 3.1  LDLDIRECT --      Assessment/Plan:    1) NSTEMI - Cath results noted; no change compared to previous; continue ASA, statin and beta blocker; DDimer elevated; ? PE contributing to elevated enzymes; schedule VQ. 2) murmur - echo to evaluate AS. 3) HTN - continue present BP meds; resume lasix at outpatient dose; await AM BMET. 4) Hyperlipidemia - Continue statin. 5) H/O CAD 6) Hypothyroid - TSH mildly elevated; fu with her primary care for synthroid adjustment DC later today if VQ neg and after echo. FU with me 2-4 weeks  Olga Millers 07/03/2011, 6:41 AM

## 2011-07-04 LAB — CBC
HCT: 34.9 % — ABNORMAL LOW (ref 36.0–46.0)
Hemoglobin: 11.4 g/dL — ABNORMAL LOW (ref 12.0–15.0)
WBC: 6.9 10*3/uL (ref 4.0–10.5)

## 2011-07-04 LAB — HEPARIN LEVEL (UNFRACTIONATED): Heparin Unfractionated: <0.1 IU/mL — ABNORMAL LOW (ref 0.30–0.70)

## 2011-07-04 MED ORDER — NITROGLYCERIN 0.4 MG SL SUBL: 0.4000 mg | SUBLINGUAL_TABLET | SUBLINGUAL | Status: DC | PRN

## 2011-07-04 NOTE — Progress Notes (Signed)
   CARE MANAGEMENT NOTE 07/04/2011  Patient:  Holly Gibson, Holly Gibson   Account Number:  [id number]  Date Initiated:  07/04/2011  Documentation initiated by:  GRAVES-BIGELOW,Lesley Atkin  Subjective/Objective Assessment:   Pt admitted with nstemi. Pt is from [facility] and has no additional home needs.     Action/Plan:   Anticipated DC Date:  07/04/2011   Anticipated DC Plan:  ASSISTED LIVING / REST HOME  In-house referral  Clinical Social Worker      DC Planning Services  CM consult      Choice offered to / List presented to:             Status of service:  Completed, signed off Medicare Important Message given?   (If response is "NO", the following Medicare IM given date fields will be blank) Date Medicare IM given:   Date Additional Medicare IM given:    Discharge Disposition:  ASSISTED LIVING  Per UR Regulation:    Comments:  07-04-11 [address] Tomi Bamberger, RN,BSN [phone] No other needs assessed by CM.

## 2011-07-04 NOTE — Progress Notes (Signed)
CARDIAC REHAB PHASE I   PRE:  Rate/Rhythm: 77 SR  BP:  Supine:   Sitting: 94/32 85/47  Standing:    SaO2: 95% RA  MODE:  Ambulation: 350 ft   POST:  Rate/Rhythem: 80 SR  BP:  Supine:   Sitting: 103/44  Standing:    SaO2:   0930 -1040 Pt ambulated with minimal assist. Tolerated well, no complaints. Back to chair with call bell in reach. Family in with for education. Ok to refer to out pt cardiac rehab [place].   Rosalie Doctor

## 2011-07-04 NOTE — Progress Notes (Signed)
@   Subjective:  Denies CP or dyspnea.   Objective:  Filed Vitals:   07/03/11 1605 07/03/11 1945 07/04/11 0000 07/04/11 0345  BP: 118/77 95/52 121/52 122/52  Pulse:      Temp:  98.8 F (37.1 C) 97.3 F (36.3 C) 97.8 F (36.6 C)  TempSrc:  Oral Oral Oral  Resp: 18 15 12 12   Height:      Weight:      SpO2:  95% 96% 94%    Intake/Output from previous day:  Intake/Output Summary (Last 24 hours) at 07/04/11 0741 Last data filed at 07/04/11 0700  Gross per 24 hour  Intake    365 ml  Output   1200 ml  Net   -835 ml    Physical Exam: Physical exam: Well-developed well-nourished in no acute distress.  Skin is warm and dry.  HEENT is normal.  Neck is supple. No thyromegaly.  Chest is clear to auscultation with normal expansion.  Cardiovascular exam is regular rate and rhythm. 2/6 systolic murmur Abdominal exam nontender or distended. No masses palpated. Right groin with no hematoma or bruit; mild ecchymosis Extremities show no edema. neuro grossly intact    Lab Results: Basic Metabolic Panel:  Basename 07/03/11 0550 07/02/11 1147  NA 138 140  K 3.7 3.8  CL 103 104  CO2 25 26  GLUCOSE 99 88  BUN 10 11  CREATININE 0.72 0.71  CALCIUM 8.9 9.3  MG -- --  PHOS -- --   Liver Function Tests:  [facility] 07/02/11 0720  AST 30  ALT 12  ALKPHOS 80  BILITOT 0.7  PROT 6.3  ALBUMIN 3.3*   No results found for this basename: LIPASE:2,AMYLASE:2 in the last 72 hours CBC:  Basename 07/04/11 0625 07/03/11 0550 07/01/11 2022  WBC 6.9 8.6 --  NEUTROABS -- -- 4.5  HGB 11.4* 12.3 --  HCT 34.9* 37.4 --  MCV 101.7* 102.5* --  PLT 190 208 --   Cardiac Enzymes:  Basename 07/02/11 1230 07/02/11 0720 07/01/11 2022  CKTOTAL 127 137 --  CKMB 9.6* 11.9* --  CKMBINDEX -- -- --  TROPONINI 2.30* 2.92* 2.91*   Fasting Lipid Panel:  Basename 07/02/11 0720  CHOL 180  HDL 59  LDLCALC 102*  TRIG 93  CHOLHDL 3.1  LDLDIRECT --      Assessment/Plan:  1) NSTEMI - Cath  results noted; no change compared to previous; continue ASA, statin and beta blocker; DDimer elevated but VQ negative. 2) murmur - echo shows moderate AS; plan medical therapy. 3) HTN - continue present BP meds 4) Hyperlipidemia - Continue statin. 5) H/O CAD 6) Hypothyroid - TSH mildly elevated; fu with her primary care for synthroid adjustment DC today and fu with me 2-4 weeks > 30 min PA and physician time D2  Olga Millers 07/04/2011, 7:41 AM

## 2011-07-04 NOTE — Discharge Summary (Signed)
Physician Discharge Summary  Patient ID: Holly Gibson MRN: [id number] DOB/AGE: 1919-01-19 77 y.o.  Admit date: 07/01/2011 Discharge date: 07/04/2011  Primary Discharge Diagnosis: NSTEMI, med Rx Secondary Discharge Diagnosis:  Patient Active Problem List  Diagnoses  . SEM - AS   HTN   Elevated lipids   Low thyroid              Significant Diagnostic Studies:Left Heart Cath, Selective Coronary Angiography, LV angiography, VQ scan  Hospital Course:Holly Gibson is a 77 y.o.female with known CAD and LHC in 2005 with occluded RCA per her report who is brought to the ED by her granddaughter due to DOE.    She ruled in for NSTEMI, she was pain-free on med Rx. Cath on 12-3 showed: Left mainstem: Luminal irregulars  Left anterior descending (LAD): Proximal and mid severe calcification. Long proximal 25% stenosis. First diagonal very small normal. Second diagonal moderate size normal. There diagonal moderate size normal.  Left circumflex (LCx): AV groove proximal moderate calcification with proximal luminal irregularities. There is a very large branching obtuse marginal with mild luminal irregularities.  Right coronary artery (RCA): Right coronary is dominant. There is severe calcification. It is occluded in the midsegment. There is brisk collateralization predominantly from the LAD but also slightly from the circumflex.  Left ventriculography: Left ventricular systolic function is normal, LVEF is estimated at 65%, there is no significant mitral regurgitation. There was an aortic valvular gradient noted. There was significant mitral annular calcification.  Final Conclusions: High-grade single-vessel obstructive coronary disease which has been reported previously. Preserved ejection fraction. There is a peak to peak aortic gradient.  Recommendations: The patient will have a d-dimer. Consideration will be given to further imaging to rule out pulmonary emboli if this is elevated. She will have an  outpatient echocardiogram to evaluate the degree of aortic stenosis.  D-dimer was elevated but VQ did not show PE. An echo was performed with these results: Study Conclusions - Left ventricle: The cavity size was normal. Wall thickness was increased in a pattern of mild LVH. Systolic function was normal. The estimated ejection fraction was in the range of 55% to 60%. Wall motion was normal; there were no regional wall motion abnormalities. Doppler parameters are consistent with abnormal left ventricular relaxation (grade 1 diastolic dysfunction). - Aortic valve: Probably moderate aortic stenosis. Mean gradient: 23mm Hg (S). Peak gradient: 36mm Hg (S). Valve area: 1.02cm^2(VTI). Valve area: 0.94cm^2 (Vmax).  She is on ASA, statin and BB. TSH was mildly elevated, f/u with primary MD.  On 12-5, Holly Gibson was ambulating without CP/SOB. She was evaluated by Dr Jens Som and considered stable for d/c, to f/u as OP.  Labs: Lab Results  Component Value Date   WBC 6.9 07/04/2011   HGB 11.4* 07/04/2011   HCT 34.9* 07/04/2011   MCV 101.7* 07/04/2011   PLT 190 07/04/2011    Lab 07/03/11 0550 07/02/11 0720  NA 138 --  K 3.7 --  CL 103 --  CO2 25 --  BUN 10 --  CREATININE 0.72 --  CALCIUM 8.9 --  PROT -- 6.3  BILITOT -- 0.7  ALKPHOS -- 80  ALT -- 12  AST -- 30  GLUCOSE 99 --   Lab Results  Component Value Date   CKTOTAL 127 07/02/2011   CKMB 9.6* 07/02/2011   TROPONINI 2.30* 07/02/2011    Lab Results  Component Value Date   CHOL 180 07/02/2011   Lab Results  Component Value Date   HDL 59 07/02/2011  Lab Results  Component Value Date   LDLCALC 102* 07/02/2011   Lab Results  Component Value Date   TRIG 93 07/02/2011   Lab Results  Component Value Date   CHOLHDL 3.1 07/02/2011   No results found for this basename: LDLDIRECT     FOLLOW UP PLANS AND APPOINTMENTS Discharge Orders    Future Appointments: Provider: Department: Dept Phone: Center:   F/u with primary care as  well       08/06/2011 8:45 AM Lewayne Bunting, MD [facility] [phone] [facility]     Future Orders Please Complete By Expires   Diet - low sodium heart healthy      Increase activity slowly      Call MD for:  redness, tenderness, or signs of infection (pain, swelling, redness, odor or green/yellow discharge around incision site)        Current Discharge Medication List    START taking these medications   Details  nitroGLYCERIN (NITROSTAT) 0.4 MG SL tablet Place 1 tablet (0.4 mg total) under the tongue every 5 (five) minutes as needed for chest pain. Qty: 25 tablet, Refills: 3      CONTINUE these medications which have NOT CHANGED   Details  acetaminophen (TYLENOL) 650 MG CR tablet Take 1,300 mg by mouth 2 (two) times daily as needed. PAIN      aspirin 81 MG chewable tablet Chew 81 mg by mouth daily.      B Complex-C (B-COMPLEX WITH VITAMIN C) tablet Take 1 tablet by mouth daily.      benazepril (LOTENSIN) 10 MG tablet Take 10 mg by mouth daily.      cholecalciferol (VITAMIN D) 1000 UNITS tablet Take 1,000 Units by mouth daily.      famotidine (PEPCID) 20 MG tablet Take 20 mg by mouth 2 (two) times daily.      fexofenadine (ALLEGRA) 60 MG tablet Take 60 mg by mouth 2 (two) times daily.      furosemide (LASIX) 20 MG tablet Take 20 mg by mouth 2 (two) times daily.      gelatin 650 MG capsule Take 650 mg by mouth daily.      levothyroxine (SYNTHROID, LEVOTHROID) 88 MCG tablet Take 88 mcg by mouth daily.      metoprolol (TOPROL-XL) 50 MG 24 hr tablet Take 50 mg by mouth daily.      Misc Natural Products (OSTEO BI-FLEX JOINT SHIELD) TABS Take 1 tablet by mouth daily.      multivitamin-iron-minerals-folic acid (CENTRUM) chewable tablet Chew 1 tablet by mouth daily.      potassium chloride (KLOR-CON) 10 MEQ CR tablet Take 10-20 mEq by mouth daily.      vitamin B-12 (CYANOCOBALAMIN) 1000 MCG tablet Take 1,000 mcg by mouth daily.      vitamin E 100 UNIT capsule  Take 100 Units by mouth daily.           BRING ALL MEDICATIONS WITH YOU TO FOLLOW UP APPOINTMENTS  Time spent with patient to include physician time: Signed: Theodore Demark 07/04/2011, 9:56 AM Co-Sign MD \

## 2011-07-04 NOTE — Progress Notes (Signed)
Pt was discharged to home with granddaughter.  Discharge instructions were given to pt and granddaughter and prescriptions were given to pt.  Pt was escorted to door via wheelchair with RN

## 2011-08-03 ENCOUNTER — Encounter: Payer: Self-pay | Admitting: Cardiology

## 2011-08-03 ENCOUNTER — Encounter: Payer: Self-pay | Admitting: *Deleted

## 2011-08-06 ENCOUNTER — Ambulatory Visit (INDEPENDENT_AMBULATORY_CARE_PROVIDER_SITE_OTHER): Payer: Medicare Other | Admitting: Cardiology

## 2011-08-06 ENCOUNTER — Encounter: Payer: Self-pay | Admitting: Cardiology

## 2011-08-06 DIAGNOSIS — E039 Hypothyroidism, unspecified: Secondary | ICD-10-CM

## 2011-08-06 DIAGNOSIS — I350 Nonrheumatic aortic (valve) stenosis: Secondary | ICD-10-CM

## 2011-08-06 DIAGNOSIS — I2510 Atherosclerotic heart disease of native coronary artery without angina pectoris: Secondary | ICD-10-CM

## 2011-08-06 DIAGNOSIS — I10 Essential (primary) hypertension: Secondary | ICD-10-CM

## 2011-08-06 DIAGNOSIS — Z79899 Other long term (current) drug therapy: Secondary | ICD-10-CM

## 2011-08-06 DIAGNOSIS — I359 Nonrheumatic aortic valve disorder, unspecified: Secondary | ICD-10-CM

## 2011-08-06 DIAGNOSIS — E7800 Pure hypercholesterolemia, unspecified: Secondary | ICD-10-CM

## 2011-08-06 MED ORDER — PRAVASTATIN SODIUM 20 MG PO TABS: 20.0000 mg | ORAL_TABLET | Freq: Every evening | ORAL | Status: DC

## 2011-08-06 NOTE — Progress Notes (Signed)
HPI: 78 year old female recently admitted to [facility] with chest pain and an elevated troponin. Cardiac catheterization in December of 2012 revealed minor irregularities in the left main. There is a 25% LAD. There were proximal luminal irregularities in the circumflex. The right coronary artery was noted to be occluded. There was collateralization from the LAD and circumflex. The ejection fraction was 65%. Medical therapy recommended. VQ scan was very low probability. Echocardiogram in December of 2012 showed an ejection fraction of 55-60%. There was grade 1 diastolic dysfunction. There was moderate aortic stenosis with a mean gradient of 23 mm of mercury. The left atrium was moderately dilated. TSH mildly elevated at 5.401. Since she was discharged the patient has dyspnea with more extreme activities but not with routine activities. It is relieved with rest. It is not associated with chest pain. There is no orthopnea, PND or pedal edema. There is no syncope or palpitations. There is no exertional chest pain.   Current Outpatient Prescriptions  Medication Sig Dispense Refill  . acetaminophen (TYLENOL) 650 MG CR tablet Take 1,300 mg by mouth 2 (two) times daily as needed. PAIN        . aspirin 81 MG chewable tablet Chew 81 mg by mouth daily.        . B Complex-C (B-COMPLEX WITH VITAMIN C) tablet Take 1 tablet by mouth daily.        . benazepril (LOTENSIN) 10 MG tablet Take 10 mg by mouth daily.        . cholecalciferol (VITAMIN D) 1000 UNITS tablet Take 1,000 Units by mouth daily.        . famotidine (PEPCID) 20 MG tablet Take 20 mg by mouth 2 (two) times daily.        . fexofenadine (ALLEGRA) 60 MG tablet Take 60 mg by mouth 2 (two) times daily.        . furosemide (LASIX) 20 MG tablet Take 20 mg by mouth 2 (two) times daily.        Marland Kitchen gelatin 650 MG capsule Take 650 mg by mouth daily.        Marland Kitchen levothyroxine (SYNTHROID, LEVOTHROID) 88 MCG tablet Take 88 mcg by mouth daily.        .  metoprolol (TOPROL-XL) 50 MG 24 hr tablet Take 50 mg by mouth daily.        . Misc Natural Products (OSTEO BI-FLEX JOINT SHIELD) TABS Take 1 tablet by mouth daily.        . multivitamin-iron-minerals-folic acid (CENTRUM) chewable tablet Chew 1 tablet by mouth daily.        . nitroGLYCERIN (NITROSTAT) 0.4 MG SL tablet Place 1 tablet (0.4 mg total) under the tongue every 5 (five) minutes as needed for chest pain.  25 tablet  12  . potassium chloride (KLOR-CON) 10 MEQ CR tablet Take 10 mEq by mouth daily. TWO TABLETS DAILY      . vitamin B-12 (CYANOCOBALAMIN) 1000 MCG tablet Take 1,000 mcg by mouth daily.        . vitamin E 100 UNIT capsule Take 100 Units by mouth daily.           Past Medical History  Diagnosis Date  . Essential hypertension   . Coronary atherosclerosis 2005    Followed by Dr. Avel Sensor in [place], [place] previously.  Had LHC with occluded RCA in 2005 per patient report  . Pneumonia 2011  . Aortic stenosis   . Spinal stenosis 2008  . Polio   .  Hypothyroidism     Past Surgical History  Procedure Date  . Joint replacement   . Shoulder debridement   . Tonsillectomy   . Appendectomy   . Cholecystectomy   . Total knee arthroplasty   . Thyroidectomy   . Thyroidectomy     History   Social History  . Marital Status: Widowed    Spouse Name: N/A    Number of Children: N/A  . Years of Education: N/A   Occupational History  . Not on file.   Social History Main Topics  . Smoking status: Former Smoker    Types: Cigarettes    Quit date: 07/30/1962  . Smokeless tobacco: Never Used  . Alcohol Use: 16.8 oz/week    14 Glasses of wine, 14 Shots of liquor per week  . Drug Use: No  . Sexually Active: Not on file   Other Topics Concern  . Not on file   Social History Narrative  . No narrative on file    ROS: Problems with back pain but  no fevers or chills, productive cough, hemoptysis, dysphasia, odynophagia, melena, hematochezia, dysuria, hematuria, rash,  seizure activity, orthopnea, PND, pedal edema, claudication. Remaining systems are negative.  Physical Exam: Well-developed well-nourished in no acute distress.  Skin is warm and dry.  HEENT is normal.  Neck is supple. No thyromegaly.  Chest is clear to auscultation with normal expansion.  Cardiovascular exam is regular rate and rhythm. 3/6 systolic murmur left sternal border Abdominal exam nontender or distended. No masses palpated. Extremities show no edema. neuro grossly intact

## 2011-08-06 NOTE — Patient Instructions (Signed)
Your physician wants you to follow-up in: 6 MONTHS You will receive a reminder letter in the mail two months in advance. If you don't receive a letter, please call our office to schedule the follow-up appointment.  START PRAVASTATIN 20 MG ONCE DAILY AT BEDTIME  Your physician recommends that you return for lab work in: 6 WEEKS AFTER STARTING PRAVASTATIN=

## 2011-08-06 NOTE — Assessment & Plan Note (Signed)
Blood pressure controlled. Continue present medications. 

## 2011-08-06 NOTE — Assessment & Plan Note (Signed)
Continue aspirin and beta blocker. Add statin. Begin Pravachol 20 mg daily. Check lipids and liver in 6 weeks.

## 2011-08-06 NOTE — Assessment & Plan Note (Signed)
Moderate by echocardiogram. Conservative measures if possible given age.

## 2011-08-06 NOTE — Assessment & Plan Note (Signed)
Patient has seen her primary care physician for this issue.

## 2011-08-07 ENCOUNTER — Other Ambulatory Visit: Payer: Self-pay | Admitting: Cardiology

## 2011-08-07 DIAGNOSIS — E7800 Pure hypercholesterolemia, unspecified: Secondary | ICD-10-CM

## 2011-08-07 DIAGNOSIS — Z79899 Other long term (current) drug therapy: Secondary | ICD-10-CM

## 2011-08-07 MED ORDER — PRAVASTATIN SODIUM 20 MG PO TABS: 20.0000 mg | ORAL_TABLET | Freq: Every evening | ORAL | Status: DC

## 2011-08-07 NOTE — Telephone Encounter (Signed)
Spoke with pt, aware okay to wait for pravachol to come in the mail

## 2011-08-07 NOTE — Telephone Encounter (Signed)
Pt wants to know since it will take about 2wks for her to get her meds can we call some in to [facility] on [address] so she can go ahead and start taking it

## 2011-09-06 ENCOUNTER — Encounter (HOSPITAL_COMMUNITY)
Admission: RE | Admit: 2011-09-06 | Discharge: 2011-09-06 | Disposition: A | Discharge status: Home / Self Care | Payer: Medicare Other | Source: Ambulatory Visit | Attending: Cardiology | Admitting: Cardiology

## 2011-09-06 ENCOUNTER — Encounter (HOSPITAL_COMMUNITY): Payer: Self-pay

## 2011-09-10 ENCOUNTER — Encounter (HOSPITAL_COMMUNITY): Admission: RE | Admit: 2011-09-10 | Payer: Medicare Other | Source: Ambulatory Visit

## 2011-09-10 ENCOUNTER — Ambulatory Visit (HOSPITAL_COMMUNITY): Payer: Medicare Other

## 2011-09-12 ENCOUNTER — Ambulatory Visit (HOSPITAL_COMMUNITY): Payer: Medicare Other

## 2011-09-12 ENCOUNTER — Encounter (HOSPITAL_COMMUNITY): Payer: Medicare Other

## 2011-09-14 ENCOUNTER — Encounter (HOSPITAL_COMMUNITY): Payer: Medicare Other

## 2011-09-14 ENCOUNTER — Ambulatory Visit (HOSPITAL_COMMUNITY): Payer: Medicare Other

## 2011-09-17 ENCOUNTER — Encounter (HOSPITAL_COMMUNITY): Payer: Medicare Other

## 2011-09-17 ENCOUNTER — Ambulatory Visit (HOSPITAL_COMMUNITY): Payer: Medicare Other

## 2011-09-19 ENCOUNTER — Ambulatory Visit (HOSPITAL_COMMUNITY): Payer: Medicare Other

## 2011-09-19 ENCOUNTER — Encounter (HOSPITAL_COMMUNITY): Payer: Medicare Other

## 2011-09-21 ENCOUNTER — Ambulatory Visit (HOSPITAL_COMMUNITY): Payer: Medicare Other

## 2011-09-21 ENCOUNTER — Encounter (HOSPITAL_COMMUNITY): Payer: Medicare Other

## 2011-09-24 ENCOUNTER — Ambulatory Visit (HOSPITAL_COMMUNITY): Payer: Medicare Other

## 2011-09-24 ENCOUNTER — Encounter (HOSPITAL_COMMUNITY): Payer: Medicare Other

## 2011-09-26 ENCOUNTER — Ambulatory Visit (HOSPITAL_COMMUNITY): Payer: Medicare Other

## 2011-09-26 ENCOUNTER — Encounter (HOSPITAL_COMMUNITY): Payer: Medicare Other

## 2011-09-26 ENCOUNTER — Other Ambulatory Visit: Payer: Medicare Other

## 2011-09-27 ENCOUNTER — Other Ambulatory Visit: Payer: Medicare Other

## 2011-09-27 ENCOUNTER — Other Ambulatory Visit (INDEPENDENT_AMBULATORY_CARE_PROVIDER_SITE_OTHER): Payer: Medicare Other

## 2011-09-27 DIAGNOSIS — E7800 Pure hypercholesterolemia, unspecified: Secondary | ICD-10-CM

## 2011-09-27 DIAGNOSIS — Z79899 Other long term (current) drug therapy: Secondary | ICD-10-CM

## 2011-09-27 LAB — LIPID PANEL
HDL: 70.5 mg/dL (ref >39.00)
LDL Cholesterol: 99 mg/dL (ref 0–99)
VLDL: 13.8 mg/dL (ref 0.0–40.0)

## 2011-09-27 LAB — HEPATIC FUNCTION PANEL
Bilirubin, Direct: 0.1 mg/dL (ref 0.0–0.3)
Total Bilirubin: 0.7 mg/dL (ref 0.3–1.2)

## 2011-09-28 ENCOUNTER — Encounter (HOSPITAL_COMMUNITY): Payer: Medicare Other

## 2011-09-28 ENCOUNTER — Ambulatory Visit (HOSPITAL_COMMUNITY): Payer: Medicare Other

## 2011-09-28 ENCOUNTER — Telehealth: Payer: Self-pay | Admitting: Cardiology

## 2011-09-28 DIAGNOSIS — Z79899 Other long term (current) drug therapy: Secondary | ICD-10-CM

## 2011-09-28 DIAGNOSIS — E7800 Pure hypercholesterolemia, unspecified: Secondary | ICD-10-CM

## 2011-09-28 MED ORDER — PRAVASTATIN SODIUM 40 MG PO TABS: 40.0000 mg | ORAL_TABLET | Freq: Every evening | ORAL | Status: DC

## 2011-09-28 NOTE — Telephone Encounter (Signed)
Patient returning nurse DM call, she can be reached at hm# [phone]

## 2011-09-28 NOTE — Telephone Encounter (Signed)
Pt aware of labs and increase of pravastatin.

## 2011-10-01 ENCOUNTER — Ambulatory Visit (HOSPITAL_COMMUNITY): Payer: Medicare Other

## 2011-10-01 ENCOUNTER — Encounter (HOSPITAL_COMMUNITY): Payer: Medicare Other

## 2011-10-03 ENCOUNTER — Ambulatory Visit (HOSPITAL_COMMUNITY): Payer: Medicare Other

## 2011-10-03 ENCOUNTER — Encounter (HOSPITAL_COMMUNITY): Payer: Medicare Other

## 2011-10-05 ENCOUNTER — Encounter (HOSPITAL_COMMUNITY): Payer: Medicare Other

## 2011-10-05 ENCOUNTER — Ambulatory Visit (HOSPITAL_COMMUNITY): Payer: Medicare Other

## 2011-10-08 ENCOUNTER — Encounter (HOSPITAL_COMMUNITY): Payer: Medicare Other

## 2011-10-08 ENCOUNTER — Ambulatory Visit (HOSPITAL_COMMUNITY): Payer: Medicare Other

## 2011-10-10 ENCOUNTER — Encounter (HOSPITAL_COMMUNITY): Payer: Medicare Other

## 2011-10-10 ENCOUNTER — Ambulatory Visit (HOSPITAL_COMMUNITY): Payer: Medicare Other

## 2011-10-12 ENCOUNTER — Encounter (HOSPITAL_COMMUNITY): Payer: Medicare Other

## 2011-10-12 ENCOUNTER — Ambulatory Visit (HOSPITAL_COMMUNITY): Payer: Medicare Other

## 2011-10-15 ENCOUNTER — Encounter (HOSPITAL_COMMUNITY): Payer: Medicare Other

## 2011-10-15 ENCOUNTER — Ambulatory Visit (HOSPITAL_COMMUNITY): Payer: Medicare Other

## 2011-10-17 ENCOUNTER — Encounter (HOSPITAL_COMMUNITY): Payer: Medicare Other

## 2011-10-17 ENCOUNTER — Ambulatory Visit (HOSPITAL_COMMUNITY): Payer: Medicare Other

## 2011-10-19 ENCOUNTER — Encounter (HOSPITAL_COMMUNITY): Payer: Medicare Other

## 2011-10-19 ENCOUNTER — Ambulatory Visit (HOSPITAL_COMMUNITY): Payer: Medicare Other

## 2011-10-22 ENCOUNTER — Ambulatory Visit (HOSPITAL_COMMUNITY): Payer: Medicare Other

## 2011-10-22 ENCOUNTER — Encounter (HOSPITAL_COMMUNITY): Payer: Medicare Other

## 2011-10-24 ENCOUNTER — Ambulatory Visit (HOSPITAL_COMMUNITY): Payer: Medicare Other

## 2011-10-24 ENCOUNTER — Encounter (HOSPITAL_COMMUNITY): Payer: Medicare Other

## 2011-10-26 ENCOUNTER — Encounter (HOSPITAL_COMMUNITY): Payer: Medicare Other

## 2011-10-26 ENCOUNTER — Ambulatory Visit (HOSPITAL_COMMUNITY): Payer: Medicare Other

## 2011-10-29 ENCOUNTER — Encounter (HOSPITAL_COMMUNITY): Payer: Medicare Other

## 2011-10-29 ENCOUNTER — Ambulatory Visit (HOSPITAL_COMMUNITY): Payer: Medicare Other

## 2011-10-31 ENCOUNTER — Ambulatory Visit (HOSPITAL_COMMUNITY): Payer: Medicare Other

## 2011-10-31 ENCOUNTER — Encounter (HOSPITAL_COMMUNITY): Payer: Medicare Other

## 2011-11-02 ENCOUNTER — Encounter (HOSPITAL_COMMUNITY): Payer: Medicare Other

## 2011-11-02 ENCOUNTER — Ambulatory Visit (HOSPITAL_COMMUNITY): Payer: Medicare Other

## 2011-11-05 ENCOUNTER — Ambulatory Visit (HOSPITAL_COMMUNITY): Payer: Medicare Other

## 2011-11-05 ENCOUNTER — Encounter (HOSPITAL_COMMUNITY): Payer: Medicare Other

## 2011-11-07 ENCOUNTER — Ambulatory Visit (HOSPITAL_COMMUNITY): Payer: Medicare Other

## 2011-11-07 ENCOUNTER — Encounter (HOSPITAL_COMMUNITY): Payer: Medicare Other

## 2011-11-09 ENCOUNTER — Encounter (HOSPITAL_COMMUNITY): Payer: Medicare Other

## 2011-11-09 ENCOUNTER — Ambulatory Visit (HOSPITAL_COMMUNITY): Payer: Medicare Other

## 2011-11-12 ENCOUNTER — Ambulatory Visit (HOSPITAL_COMMUNITY): Payer: Medicare Other

## 2011-11-12 ENCOUNTER — Encounter (HOSPITAL_COMMUNITY): Payer: Medicare Other

## 2011-11-14 ENCOUNTER — Other Ambulatory Visit: Payer: Medicare Other

## 2011-11-14 ENCOUNTER — Encounter (HOSPITAL_COMMUNITY): Payer: Medicare Other

## 2011-11-14 ENCOUNTER — Ambulatory Visit (HOSPITAL_COMMUNITY): Payer: Medicare Other

## 2011-11-15 ENCOUNTER — Other Ambulatory Visit: Payer: Self-pay | Admitting: *Deleted

## 2011-11-15 ENCOUNTER — Other Ambulatory Visit (INDEPENDENT_AMBULATORY_CARE_PROVIDER_SITE_OTHER): Payer: Medicare Other

## 2011-11-15 DIAGNOSIS — E7800 Pure hypercholesterolemia, unspecified: Secondary | ICD-10-CM

## 2011-11-15 LAB — LIPID PANEL
HDL: 62.9 mg/dL (ref >39.00)
LDL Cholesterol: 98 mg/dL (ref 0–99)
Total CHOL/HDL Ratio: 3
Triglycerides: 67 mg/dL (ref 0.0–149.0)

## 2011-11-15 LAB — HEPATIC FUNCTION PANEL
ALT: 17 U/L (ref 0–35)
Alkaline Phosphatase: 62 U/L (ref 39–117)
Bilirubin, Direct: 0.1 mg/dL (ref 0.0–0.3)
Total Bilirubin: 0.6 mg/dL (ref 0.3–1.2)
Total Protein: 7 g/dL (ref 6.0–8.3)

## 2011-11-16 ENCOUNTER — Encounter (HOSPITAL_COMMUNITY): Payer: Medicare Other

## 2011-11-16 ENCOUNTER — Ambulatory Visit (HOSPITAL_COMMUNITY): Payer: Medicare Other

## 2011-11-19 ENCOUNTER — Ambulatory Visit (HOSPITAL_COMMUNITY): Payer: Medicare Other

## 2011-11-19 ENCOUNTER — Encounter (HOSPITAL_COMMUNITY): Payer: Medicare Other

## 2011-11-21 ENCOUNTER — Ambulatory Visit (HOSPITAL_COMMUNITY): Payer: Medicare Other

## 2011-11-21 ENCOUNTER — Encounter (HOSPITAL_COMMUNITY): Payer: Medicare Other

## 2011-11-23 ENCOUNTER — Encounter (HOSPITAL_COMMUNITY): Payer: Medicare Other

## 2011-11-23 ENCOUNTER — Ambulatory Visit (HOSPITAL_COMMUNITY): Payer: Medicare Other

## 2011-11-26 ENCOUNTER — Ambulatory Visit (HOSPITAL_COMMUNITY): Payer: Medicare Other

## 2011-11-26 ENCOUNTER — Encounter (HOSPITAL_COMMUNITY): Payer: Medicare Other

## 2011-11-28 ENCOUNTER — Ambulatory Visit (HOSPITAL_COMMUNITY): Payer: Medicare Other

## 2011-11-28 ENCOUNTER — Encounter (HOSPITAL_COMMUNITY): Payer: Medicare Other

## 2011-11-30 ENCOUNTER — Ambulatory Visit (HOSPITAL_COMMUNITY): Payer: Medicare Other

## 2011-11-30 ENCOUNTER — Encounter (HOSPITAL_COMMUNITY): Payer: Medicare Other

## 2011-12-03 ENCOUNTER — Encounter (HOSPITAL_COMMUNITY): Payer: Medicare Other

## 2011-12-03 ENCOUNTER — Ambulatory Visit (HOSPITAL_COMMUNITY): Payer: Medicare Other

## 2011-12-05 ENCOUNTER — Ambulatory Visit (HOSPITAL_COMMUNITY): Payer: Medicare Other

## 2011-12-05 ENCOUNTER — Encounter (HOSPITAL_COMMUNITY): Payer: Medicare Other

## 2011-12-07 ENCOUNTER — Ambulatory Visit (HOSPITAL_COMMUNITY): Payer: Medicare Other

## 2011-12-07 ENCOUNTER — Encounter (HOSPITAL_COMMUNITY): Payer: Medicare Other

## 2011-12-10 ENCOUNTER — Encounter (HOSPITAL_COMMUNITY): Payer: Medicare Other

## 2011-12-10 ENCOUNTER — Ambulatory Visit (HOSPITAL_COMMUNITY): Payer: Medicare Other

## 2011-12-12 ENCOUNTER — Ambulatory Visit (HOSPITAL_COMMUNITY): Payer: Medicare Other

## 2011-12-12 ENCOUNTER — Encounter (HOSPITAL_COMMUNITY): Payer: Medicare Other

## 2011-12-14 ENCOUNTER — Ambulatory Visit (HOSPITAL_COMMUNITY): Payer: Medicare Other

## 2011-12-14 ENCOUNTER — Encounter (HOSPITAL_COMMUNITY): Payer: Medicare Other

## 2012-02-04 ENCOUNTER — Ambulatory Visit: Payer: Medicare Other | Admitting: Cardiology

## 2012-02-09 IMAGING — NM NM PULMONARY VENT & PERF
2 series · 12 of 12 positions shown · non-contrast
Comparison: The chest radiograph [DATE]

CLINICAL DATA: Dyspnea

NUCLEAR MEDICINE VENTILATION - PERFUSION LUNG SCAN
TECHNIQUE: Wash-in, equilibrium, and wash-out phase ventilation
images were obtained using 2e-2TT gas.  Perfusion images were
obtained in multiple projections after intravenous injection of Tc-
99m MAA.
Radiopharmaceuticals:  10.0 mCi 2e-2TT gas and 6.0 mCi Xc-55m MAA.

[vq scan · 2.52mm/px · 6 of 17 frames shown (1 of 2)]
[frame 2/17  full-range]
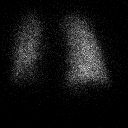
[frame 4/17  full-range]
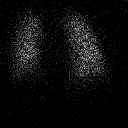
[frame 7/17]
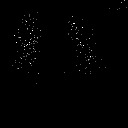
[frame 10/17]
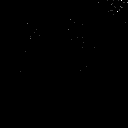
[frame 13/17]
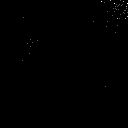
[frame 16/17]
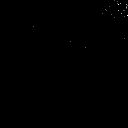

[vq scan · 2.52mm/px · 6 of 17 frames shown (2 of 2)]
[frame 2/17  full-range]
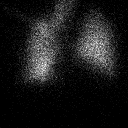
[frame 4/17  full-range]
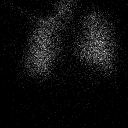
[frame 7/17  full-range]
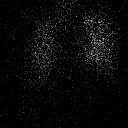
[frame 10/17]
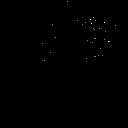
[frame 13/17]
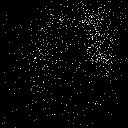
[frame 16/17]
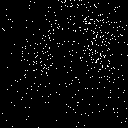

[12 of 12 positions shown; findings below may reference images not displayed]

FINDINGS: Ventilation:  There is mild decreased ventilation to the lateral
aspect of the left upper lobe seen posteriorly.

Perfusion:  There is mild decreased perfusion to the lateral aspect
of the left upper lobe posteriorly.  These perfusion defects
matched the ventilation defects.  No large wedge shaped peripheral
perfusion defects to suggest acute pulmonary embolism.
IMPRESSION: Very low probability for acute pulmonary embolism.

## 2012-02-19 ENCOUNTER — Ambulatory Visit (INDEPENDENT_AMBULATORY_CARE_PROVIDER_SITE_OTHER): Payer: Medicare Other | Admitting: Cardiology

## 2012-02-19 ENCOUNTER — Encounter: Payer: Self-pay | Admitting: Cardiology

## 2012-02-19 VITALS — BP 141/78 | HR 84 | Ht 59.0 in | Wt 195.0 lb

## 2012-02-19 DIAGNOSIS — I350 Nonrheumatic aortic (valve) stenosis: Secondary | ICD-10-CM

## 2012-02-19 DIAGNOSIS — I359 Nonrheumatic aortic valve disorder, unspecified: Secondary | ICD-10-CM

## 2012-02-19 DIAGNOSIS — I10 Essential (primary) hypertension: Secondary | ICD-10-CM

## 2012-02-19 DIAGNOSIS — E785 Hyperlipidemia, unspecified: Secondary | ICD-10-CM

## 2012-02-19 DIAGNOSIS — R609 Edema, unspecified: Secondary | ICD-10-CM

## 2012-02-19 DIAGNOSIS — I2510 Atherosclerotic heart disease of native coronary artery without angina pectoris: Secondary | ICD-10-CM

## 2012-02-19 LAB — BASIC METABOLIC PANEL
CO2: 28 mEq/L (ref 19–32)
Calcium: 9.3 mg/dL (ref 8.4–10.5)
Chloride: 99 mEq/L (ref 96–112)
Creatinine, Ser: 0.8 mg/dL (ref 0.4–1.2)
Sodium: 137 mEq/L (ref 135–145)

## 2012-02-19 LAB — HEPATIC FUNCTION PANEL
ALT: 17 U/L (ref 0–35)
AST: 21 U/L (ref 0–37)
Albumin: 4 g/dL (ref 3.5–5.2)
Alkaline Phosphatase: 63 U/L (ref 39–117)
Total Protein: 7 g/dL (ref 6.0–8.3)

## 2012-02-19 LAB — LIPID PANEL
Cholesterol: 186 mg/dL (ref 0–200)
HDL: 76.3 mg/dL (ref >39.00)
Total CHOL/HDL Ratio: 2
Triglycerides: 78 mg/dL (ref 0.0–149.0)

## 2012-02-19 NOTE — Patient Instructions (Addendum)
Your physician wants you to follow-up in: 6 MONTHS WITH DR Jens Som You will receive a reminder letter in the mail two months in advance. If you don't receive a letter, please call our office to schedule the follow-up appointment.   Your physician recommends that you HAVE LAB WORK TODAY  TAKE EXTRA 20 MG OF FUROSEMIDE AS NEEDED FOR SWELLING

## 2012-02-19 NOTE — Progress Notes (Signed)
HPI: 78 year old female for fu of CAD and AS. Cardiac catheterization in December of 2012 revealed minor irregularities in the left main. There is a 25% LAD. There were proximal luminal irregularities in the circumflex. The right coronary artery was noted to be occluded. There was collateralization from the LAD and circumflex. The ejection fraction was 65%. Medical therapy recommended. VQ scan was very low probability. Echocardiogram in December of 2012 showed an ejection fraction of 55-60%. There was grade 1 diastolic dysfunction. There was moderate aortic stenosis with a mean gradient of 23 mm of mercury. The left atrium was moderately dilated. TSH mildly elevated at 5.401. I last saw her in Jan 2013. Since then, she has some dyspnea on exertion but no orthopnea or PND. She does have pedal edema. No chest pain or syncope.   Current Outpatient Prescriptions  Medication Sig Dispense Refill  . acetaminophen (TYLENOL) 650 MG CR tablet Take 1,300 mg by mouth 2 (two) times daily as needed. PAIN        . aspirin 81 MG chewable tablet Chew 81 mg by mouth daily.        . B Complex-C (B-COMPLEX WITH VITAMIN C) tablet Take 1 tablet by mouth daily.        . benazepril (LOTENSIN) 10 MG tablet Take 10 mg by mouth daily.       . cholecalciferol (VITAMIN D) 1000 UNITS tablet Take 1,000 Units by mouth daily.        . fexofenadine (ALLEGRA) 60 MG tablet Take 60 mg by mouth 2 (two) times daily.        . furosemide (LASIX) 20 MG tablet Take 20 mg by mouth 2 (two) times daily.        Marland Kitchen gelatin 650 MG capsule Take 650 mg by mouth daily.        Marland Kitchen levothyroxine (SYNTHROID, LEVOTHROID) 88 MCG tablet Take 88 mcg by mouth daily.        . metoprolol (TOPROL-XL) 50 MG 24 hr tablet Take 50 mg by mouth daily.        . Misc Natural Products (OSTEO BI-FLEX JOINT SHIELD) TABS Take 1 tablet by mouth daily.        . multivitamin-iron-minerals-folic acid (CENTRUM) chewable tablet Chew 1 tablet by mouth daily.        .  nitroGLYCERIN (NITROSTAT) 0.4 MG SL tablet Place 1 tablet (0.4 mg total) under the tongue every 5 (five) minutes as needed for chest pain.  25 tablet  12  . potassium chloride (KLOR-CON) 10 MEQ CR tablet TWO TABLETS DAILY      . pravastatin (PRAVACHOL) 40 MG tablet Take 1 tablet (40 mg total) by mouth every evening.  90 tablet  4  . vitamin B-12 (CYANOCOBALAMIN) 1000 MCG tablet Take 1,000 mcg by mouth daily.        . vitamin E 100 UNIT capsule Take 100 Units by mouth daily.           Past Medical History  Diagnosis Date  . Essential hypertension   . Coronary atherosclerosis 2005    Followed by Dr. Avel Sensor in [place], [place] previously.  Had LHC with occluded RCA in 2005 per patient report  . Pneumonia 2011  . Aortic stenosis   . Spinal stenosis 2008  . Polio   . Hypothyroidism     Past Surgical History  Procedure Date  . Joint replacement   . Shoulder debridement   . Tonsillectomy   . Appendectomy   . Cholecystectomy   .  Total knee arthroplasty   . Thyroidectomy   . Thyroidectomy     History   Social History  . Marital Status: Widowed    Spouse Name: N/A    Number of Children: N/A  . Years of Education: N/A   Occupational History  . Not on file.   Social History Main Topics  . Smoking status: Former Smoker    Types: Cigarettes    Quit date: 07/30/1962  . Smokeless tobacco: Never Used  . Alcohol Use: 16.8 oz/week    14 Glasses of wine, 14 Shots of liquor per week  . Drug Use: No  . Sexually Active: Not on file   Other Topics Concern  . Not on file   Social History Narrative  . No narrative on file    ROS: no fevers or chills, productive cough, hemoptysis, dysphasia, odynophagia, melena, hematochezia, dysuria, hematuria, rash, seizure activity, orthopnea, PND, claudication. Remaining systems are negative.  Physical Exam: Well-developed obese in no acute distress.  Skin is warm and dry.  HEENT is normal.  Neck is supple.  Chest is clear to  auscultation with normal expansion.  Cardiovascular exam is regular rate and rhythm. 2/6 systolic murmur left sternal border. Abdominal exam nontender or distended. No masses palpated. Extremities show 1-2+edema. neuro grossly intact  ECG sinus rhythm with occasional PAC. Axis normal. Low voltage. Nonspecific ST changes.

## 2012-02-19 NOTE — Assessment & Plan Note (Signed)
Continue present dose of diuretic and take additional 20 mg of Lasix daily as needed. Check potassium and renal function.

## 2012-02-19 NOTE — Assessment & Plan Note (Signed)
Plan repeat echocardiogram when she returns in 6 months. 

## 2012-02-19 NOTE — Assessment & Plan Note (Signed)
Blood pressure controlled. Continue present medications. 

## 2012-02-19 NOTE — Assessment & Plan Note (Signed)
Continue aspirin and statin. 

## 2012-02-19 NOTE — Assessment & Plan Note (Signed)
Continue statin. Check lipids and liver. 

## 2012-02-21 ENCOUNTER — Encounter: Payer: Self-pay | Admitting: *Deleted

## 2012-05-01 ENCOUNTER — Emergency Department (HOSPITAL_COMMUNITY): Payer: Medicare Other

## 2012-05-01 ENCOUNTER — Encounter (HOSPITAL_COMMUNITY): Payer: Self-pay | Admitting: Emergency Medicine

## 2012-05-01 ENCOUNTER — Inpatient Hospital Stay (HOSPITAL_COMMUNITY)
Admission: EM | Admit: 2012-05-01 | Discharge: 2012-05-03 | DRG: 281 | Disposition: A | Discharge status: Home / Self Care | Payer: Medicare Other | Attending: Cardiology | Admitting: Cardiology

## 2012-05-01 DIAGNOSIS — I214 Non-ST elevation (NSTEMI) myocardial infarction: Principal | ICD-10-CM | POA: Diagnosis present

## 2012-05-01 DIAGNOSIS — R0602 Shortness of breath: Secondary | ICD-10-CM

## 2012-05-01 DIAGNOSIS — M129 Arthropathy, unspecified: Secondary | ICD-10-CM | POA: Diagnosis present

## 2012-05-01 DIAGNOSIS — K219 Gastro-esophageal reflux disease without esophagitis: Secondary | ICD-10-CM | POA: Diagnosis present

## 2012-05-01 DIAGNOSIS — R609 Edema, unspecified: Secondary | ICD-10-CM | POA: Diagnosis present

## 2012-05-01 DIAGNOSIS — E039 Hypothyroidism, unspecified: Secondary | ICD-10-CM | POA: Diagnosis present

## 2012-05-01 DIAGNOSIS — Z8249 Family history of ischemic heart disease and other diseases of the circulatory system: Secondary | ICD-10-CM

## 2012-05-01 DIAGNOSIS — J9819 Other pulmonary collapse: Secondary | ICD-10-CM | POA: Diagnosis present

## 2012-05-01 DIAGNOSIS — E7800 Pure hypercholesterolemia, unspecified: Secondary | ICD-10-CM | POA: Diagnosis present

## 2012-05-01 DIAGNOSIS — M4800 Spinal stenosis, site unspecified: Secondary | ICD-10-CM | POA: Diagnosis present

## 2012-05-01 DIAGNOSIS — Z79899 Other long term (current) drug therapy: Secondary | ICD-10-CM

## 2012-05-01 DIAGNOSIS — Z9089 Acquired absence of other organs: Secondary | ICD-10-CM

## 2012-05-01 DIAGNOSIS — J40 Bronchitis, not specified as acute or chronic: Secondary | ICD-10-CM

## 2012-05-01 DIAGNOSIS — I2582 Chronic total occlusion of coronary artery: Secondary | ICD-10-CM | POA: Diagnosis present

## 2012-05-01 DIAGNOSIS — Z87891 Personal history of nicotine dependence: Secondary | ICD-10-CM

## 2012-05-01 DIAGNOSIS — Z8612 Personal history of poliomyelitis: Secondary | ICD-10-CM

## 2012-05-01 DIAGNOSIS — J209 Acute bronchitis, unspecified: Secondary | ICD-10-CM | POA: Diagnosis present

## 2012-05-01 DIAGNOSIS — I2510 Atherosclerotic heart disease of native coronary artery without angina pectoris: Secondary | ICD-10-CM | POA: Diagnosis present

## 2012-05-01 DIAGNOSIS — I359 Nonrheumatic aortic valve disorder, unspecified: Secondary | ICD-10-CM | POA: Diagnosis present

## 2012-05-01 DIAGNOSIS — I350 Nonrheumatic aortic (valve) stenosis: Secondary | ICD-10-CM

## 2012-05-01 DIAGNOSIS — Z96659 Presence of unspecified artificial knee joint: Secondary | ICD-10-CM

## 2012-05-01 DIAGNOSIS — I10 Essential (primary) hypertension: Secondary | ICD-10-CM | POA: Diagnosis present

## 2012-05-01 DIAGNOSIS — Z7982 Long term (current) use of aspirin: Secondary | ICD-10-CM

## 2012-05-01 DIAGNOSIS — Z885 Allergy status to narcotic agent status: Secondary | ICD-10-CM

## 2012-05-01 HISTORY — DX: Gastro-esophageal reflux disease without esophagitis: K21.9

## 2012-05-01 HISTORY — DX: Atherosclerotic heart disease of native coronary artery without angina pectoris: I25.10

## 2012-05-01 HISTORY — DX: Occlusion and stenosis of unspecified carotid artery: I65.29

## 2012-05-01 HISTORY — DX: Vitamin B12 deficiency anemia, unspecified: D51.9

## 2012-05-01 HISTORY — DX: Pure hypercholesterolemia, unspecified: E78.00

## 2012-05-01 HISTORY — DX: Asymptomatic varicose veins of unspecified lower extremity: I83.90

## 2012-05-01 HISTORY — DX: Unspecified osteoarthritis, unspecified site: M19.90

## 2012-05-01 LAB — CBC
MCV: 103.3 fL — ABNORMAL HIGH (ref 78.0–100.0)
Platelets: 158 10*3/uL (ref 150–400)
RBC: 3.64 MIL/uL — ABNORMAL LOW (ref 3.87–5.11)
RDW: 13.1 % (ref 11.5–15.5)
WBC: 8.5 10*3/uL (ref 4.0–10.5)

## 2012-05-01 LAB — BASIC METABOLIC PANEL
CO2: 26 mEq/L (ref 19–32)
Chloride: 100 mEq/L (ref 96–112)
GFR calc Af Amer: 83 mL/min — ABNORMAL LOW (ref >90)
Potassium: 3.8 mEq/L (ref 3.5–5.1)
Sodium: 136 mEq/L (ref 135–145)

## 2012-05-01 LAB — PROTIME-INR
INR: 1 (ref 0.00–1.49)
Prothrombin Time: 13.1 seconds (ref 11.6–15.2)

## 2012-05-01 LAB — POCT I-STAT TROPONIN I: Troponin i, poc: 2.06 ng/mL (ref 0.00–0.08)

## 2012-05-01 LAB — PRO B NATRIURETIC PEPTIDE: Pro B Natriuretic peptide (BNP): 978.4 pg/mL — ABNORMAL HIGH (ref 0–450)

## 2012-05-01 LAB — TROPONIN I: Troponin I: 1.09 ng/mL (ref <0.30)

## 2012-05-01 LAB — CK TOTAL AND CKMB (NOT AT ARMC): Relative Index: 3.5 — ABNORMAL HIGH (ref 0.0–2.5)

## 2012-05-01 MED ORDER — LIDOCAINE 5 % EX PTCH
1.0000 | MEDICATED_PATCH | CUTANEOUS | Status: DC
  Administered 2012-05-02: 1 via TRANSDERMAL
  Filled 2012-05-01 (×3): qty 1

## 2012-05-01 MED ORDER — ADULT MULTIVITAMIN LIQUID CH
5.0000 mL | Freq: Every day | ORAL | Status: DC
  Administered 2012-05-01: 5 mL via ORAL
  Filled 2012-05-01 (×3): qty 5

## 2012-05-01 MED ORDER — DEXTROSE 5 % IV SOLN
500.0000 mg | Freq: Once | INTRAVENOUS | Status: AC
  Administered 2012-05-01: 500 mg via INTRAVENOUS
  Filled 2012-05-01: qty 500

## 2012-05-01 MED ORDER — FAMOTIDINE 20 MG PO TABS
20.0000 mg | ORAL_TABLET | Freq: Two times a day (BID) | ORAL | Status: DC
  Administered 2012-05-01 – 2012-05-03 (×4): 20 mg via ORAL
  Filled 2012-05-01 (×5): qty 1

## 2012-05-01 MED ORDER — SODIUM CHLORIDE 0.9 % IV BOLUS (SEPSIS)
500.0000 mL | Freq: Once | INTRAVENOUS | Status: AC
  Administered 2012-05-01: 500 mL via INTRAVENOUS

## 2012-05-01 MED ORDER — ALPRAZOLAM 0.25 MG PO TABS: 0.2500 mg | ORAL_TABLET | Freq: Two times a day (BID) | ORAL | Status: DC | PRN

## 2012-05-01 MED ORDER — METOPROLOL SUCCINATE ER 50 MG PO TB24
50.0000 mg | ORAL_TABLET | Freq: Every day | ORAL | Status: DC
  Administered 2012-05-02: 50 mg via ORAL
  Filled 2012-05-01 (×3): qty 1

## 2012-05-01 MED ORDER — LORATADINE 10 MG PO TABS
10.0000 mg | ORAL_TABLET | Freq: Every day | ORAL | Status: DC
  Administered 2012-05-02 – 2012-05-03 (×2): 10 mg via ORAL
  Filled 2012-05-01 (×3): qty 1

## 2012-05-01 MED ORDER — SIMVASTATIN 20 MG PO TABS
20.0000 mg | ORAL_TABLET | Freq: Every day | ORAL | Status: DC
  Administered 2012-05-02: 20 mg via ORAL
  Filled 2012-05-01 (×2): qty 1

## 2012-05-01 MED ORDER — ALBUTEROL SULFATE (5 MG/ML) 0.5% IN NEBU
INHALATION_SOLUTION | RESPIRATORY_TRACT | Status: AC
  Filled 2012-05-01: qty 1

## 2012-05-01 MED ORDER — VITAMIN D3 25 MCG (1000 UNIT) PO TABS
1000.0000 [IU] | ORAL_TABLET | Freq: Every day | ORAL | Status: DC
  Administered 2012-05-02 – 2012-05-03 (×2): 1000 [IU] via ORAL
  Filled 2012-05-01 (×3): qty 1

## 2012-05-01 MED ORDER — ASPIRIN 81 MG PO CHEW
81.0000 mg | CHEWABLE_TABLET | Freq: Every day | ORAL | Status: DC
  Administered 2012-05-03: 81 mg via ORAL
  Filled 2012-05-01 (×2): qty 1

## 2012-05-01 MED ORDER — IOHEXOL 350 MG/ML SOLN: 100.0000 mL | Freq: Once | INTRAVENOUS | Status: AC | PRN

## 2012-05-01 MED ORDER — MELATONIN 3 MG PO TABS: 1.0000 | ORAL_TABLET | Freq: Every day | ORAL | Status: DC

## 2012-05-01 MED ORDER — ONDANSETRON HCL 4 MG/2ML IJ SOLN: 4.0000 mg | Freq: Four times a day (QID) | INTRAMUSCULAR | Status: DC | PRN

## 2012-05-01 MED ORDER — ZOLPIDEM TARTRATE 5 MG PO TABS: 5.0000 mg | ORAL_TABLET | Freq: Every evening | ORAL | Status: DC | PRN

## 2012-05-01 MED ORDER — SODIUM CHLORIDE 0.9 % IJ SOLN: 3.0000 mL | INTRAMUSCULAR | Status: DC | PRN

## 2012-05-01 MED ORDER — BENAZEPRIL HCL 10 MG PO TABS
10.0000 mg | ORAL_TABLET | Freq: Every day | ORAL | Status: DC
  Administered 2012-05-02 – 2012-05-03 (×2): 10 mg via ORAL
  Filled 2012-05-01 (×3): qty 1

## 2012-05-01 MED ORDER — B COMPLEX-C PO TABS
1.0000 | ORAL_TABLET | Freq: Every day | ORAL | Status: DC
  Administered 2012-05-02 – 2012-05-03 (×2): 1 via ORAL
  Filled 2012-05-01 (×3): qty 1

## 2012-05-01 MED ORDER — SODIUM CHLORIDE 0.9 % IV SOLN: 250.0000 mL | INTRAVENOUS | Status: DC | PRN

## 2012-05-01 MED ORDER — ACETAMINOPHEN 325 MG PO TABS: 650.0000 mg | ORAL_TABLET | ORAL | Status: DC | PRN

## 2012-05-01 MED ORDER — LEVALBUTEROL TARTRATE 45 MCG/ACT IN AERO
1.0000 | INHALATION_SPRAY | Freq: Four times a day (QID) | RESPIRATORY_TRACT | Status: DC | PRN
Start: 2012-05-01 — End: 2012-05-03
  Filled 2012-05-01: qty 15

## 2012-05-01 MED ORDER — POTASSIUM CHLORIDE CRYS ER 20 MEQ PO TBCR
20.0000 meq | EXTENDED_RELEASE_TABLET | Freq: Every day | ORAL | Status: DC
  Administered 2012-05-02 – 2012-05-03 (×2): 20 meq via ORAL
  Filled 2012-05-01 (×2): qty 1

## 2012-05-01 MED ORDER — ASPIRIN 81 MG PO CHEW
324.0000 mg | CHEWABLE_TABLET | Freq: Once | ORAL | Status: AC
  Administered 2012-05-01: 324 mg via ORAL
  Filled 2012-05-01: qty 4

## 2012-05-01 MED ORDER — VITAMIN B-12 1000 MCG PO TABS
1000.0000 ug | ORAL_TABLET | Freq: Every day | ORAL | Status: DC
  Administered 2012-05-02 – 2012-05-03 (×2): 1000 ug via ORAL
  Filled 2012-05-01 (×3): qty 1

## 2012-05-01 MED ORDER — VITAMIN E 45 MG (100 UNIT) PO CAPS
100.0000 [IU] | ORAL_CAPSULE | Freq: Every day | ORAL | Status: DC
  Administered 2012-05-02 – 2012-05-03 (×2): 100 [IU] via ORAL
  Filled 2012-05-01 (×3): qty 1

## 2012-05-01 MED ORDER — DEXTROSE 5 % IV SOLN
1.0000 g | Freq: Once | INTRAVENOUS | Status: AC
  Administered 2012-05-01: 1 g via INTRAVENOUS
  Filled 2012-05-01: qty 10

## 2012-05-01 MED ORDER — FUROSEMIDE 20 MG PO TABS
20.0000 mg | ORAL_TABLET | Freq: Two times a day (BID) | ORAL | Status: DC
  Administered 2012-05-01 – 2012-05-02 (×3): 20 mg via ORAL
  Filled 2012-05-01 (×5): qty 1

## 2012-05-01 MED ORDER — ACETAMINOPHEN 325 MG PO TABS
650.0000 mg | ORAL_TABLET | Freq: Three times a day (TID) | ORAL | Status: DC | PRN
  Administered 2012-05-02: 650 mg via ORAL
  Filled 2012-05-01: qty 2

## 2012-05-01 MED ORDER — LECITHIN 400 MG PO CAPS: 1.0000 | ORAL_CAPSULE | Freq: Two times a day (BID) | ORAL | Status: DC

## 2012-05-01 MED ORDER — LEVOTHYROXINE SODIUM 88 MCG PO TABS
88.0000 ug | ORAL_TABLET | Freq: Every day | ORAL | Status: DC
  Administered 2012-05-02 – 2012-05-03 (×2): 88 ug via ORAL
  Filled 2012-05-01 (×3): qty 1

## 2012-05-01 MED ORDER — GELATIN 650 MG PO CAPS: 650.0000 mg | ORAL_CAPSULE | Freq: Every day | ORAL | Status: DC

## 2012-05-01 MED ORDER — SODIUM CHLORIDE 0.9 % IJ SOLN
3.0000 mL | Freq: Two times a day (BID) | INTRAMUSCULAR | Status: DC
  Administered 2012-05-01 – 2012-05-02 (×3): 3 mL via INTRAVENOUS

## 2012-05-01 MED ORDER — CENTRUM PO CHEW: 1.0000 | CHEWABLE_TABLET | Freq: Every day | ORAL | Status: DC

## 2012-05-01 MED ORDER — OSTEO BI-FLEX JOINT SHIELD PO TABS: 1.0000 | ORAL_TABLET | Freq: Every day | ORAL | Status: DC

## 2012-05-01 MED ORDER — GLUCOSAMINE 1500 COMPLEX PO CAPS: 1.0000 | ORAL_CAPSULE | Freq: Every day | ORAL | Status: DC

## 2012-05-01 MED ORDER — DOCUSATE SODIUM 100 MG PO CAPS
100.0000 mg | ORAL_CAPSULE | Freq: Every day | ORAL | Status: DC | PRN
  Administered 2012-05-02: 100 mg via ORAL
  Filled 2012-05-01: qty 1

## 2012-05-01 MED ORDER — CLOBETASOL PROPIONATE 0.05 % EX CREA
1.0000 "application " | TOPICAL_CREAM | Freq: Two times a day (BID) | CUTANEOUS | Status: DC
  Administered 2012-05-01 – 2012-05-03 (×4): 1 via TOPICAL
  Filled 2012-05-01: qty 15

## 2012-05-01 MED ORDER — NITROGLYCERIN 0.4 MG SL SUBL: 0.4000 mg | SUBLINGUAL_TABLET | SUBLINGUAL | Status: DC | PRN

## 2012-05-01 NOTE — ED Notes (Signed)
Currently in xray dept  

## 2012-05-01 NOTE — ED Provider Notes (Signed)
History     CSN: [id number]  Arrival date & time 05/01/12  1202   First MD Initiated Contact with Patient 05/01/12 1218      Chief Complaint  Patient presents with  . Cough  . Shortness of Breath    (Consider location/radiation/quality/duration/timing/severity/associated sxs/prior treatment)   Patient is a 78 y.o. female presenting with shortness of breath. The history is provided by the patient.  Shortness of Breath  The current episode started 2 days ago. The problem occurs continuously. The problem has been unchanged. The problem is mild. Nothing relieves the symptoms. The symptoms are aggravated by activity. Associated symptoms include sore throat and shortness of breath. Pertinent negatives include no chest pain, no chest pressure, no orthopnea, no fever, no rhinorrhea, no stridor, no cough and no wheezing. There was no intake of a foreign body. She has not inhaled smoke recently. She has had prior hospitalizations (non recent). Her past medical history does not include asthma, bronchiolitis, past wheezing or eczema. She has been behaving normally. Urine output has been normal. The last void occurred less than 6 hours ago.    78 yo F presents with acute onset sob and cough. Symptoms began while traveling on airplane to [employer]. As plane ascended to a high altitude patient developed the sob and cough. No fever. No chest pain , nausea, diaphoresis, or emesis. Denies any unilateral leg swelling.   Past Medical History  Diagnosis Date  . Essential hypertension   . Coronary atherosclerosis 2005    Followed by Dr. Avel Sensor in [place], [place] previously.  Had LHC with occluded RCA in 2005 per patient report  . Pneumonia 2011  . Aortic stenosis   . Spinal stenosis 2008  . Polio   . Hypothyroidism     Past Surgical History  Procedure Date  . Joint replacement   . Shoulder debridement   . Tonsillectomy   . Appendectomy   . Cholecystectomy   . Total knee arthroplasty   .  Thyroidectomy   . Thyroidectomy     Family History  Problem Relation Age of Onset  . Coronary artery disease Father   . Coronary artery disease Brother   . Coronary artery disease Brother     History  Substance Use Topics  . Smoking status: Former Smoker    Types: Cigarettes    Quit date: 07/30/1962  . Smokeless tobacco: Never Used  . Alcohol Use: 16.8 oz/week    14 Glasses of wine, 14 Shots of liquor per week    OB History    Grav Para Term Preterm Abortions TAB SAB Ect Mult Living                  Review of Systems  Constitutional: Negative for fever, chills, activity change and appetite change.  HENT: Positive for sore throat. Negative for ear pain, congestion, rhinorrhea and neck pain.   Eyes: Negative for pain.  Respiratory: Positive for shortness of breath. Negative for cough, wheezing and stridor.   Cardiovascular: Negative for chest pain, palpitations and orthopnea.  Gastrointestinal: Negative for nausea, vomiting and abdominal pain.  Genitourinary: Negative for dysuria, difficulty urinating and pelvic pain.  Musculoskeletal: Negative for back pain.  Skin: Negative for rash and wound.  Neurological: Negative for weakness and headaches.  Psychiatric/Behavioral: Negative for behavioral problems, confusion and agitation.    Allergies  Codeine  Home Medications   Current Outpatient Rx  Name Route Sig Dispense Refill  . ACETAMINOPHEN ER 650 MG PO TBCR Oral Take  1,300 mg by mouth 2 (two) times daily as needed. PAIN      . ASPIRIN 81 MG PO CHEW Oral Chew 81 mg by mouth daily.      . B COMPLEX-C PO TABS Oral Take 1 tablet by mouth daily.      Marland Kitchen BENAZEPRIL HCL 10 MG PO TABS Oral Take 10 mg by mouth daily.     Marland Kitchen VITAMIN D 1000 UNITS PO TABS Oral Take 1,000 Units by mouth daily.      Marland Kitchen FEXOFENADINE HCL 60 MG PO TABS Oral Take 60 mg by mouth 2 (two) times daily.      . FUROSEMIDE 20 MG PO TABS Oral Take 20 mg by mouth 2 (two) times daily.      Marland Kitchen GELATIN 650 MG PO  CAPS Oral Take 650 mg by mouth daily.      Marland Kitchen LEVOTHYROXINE SODIUM 88 MCG PO TABS Oral Take 88 mcg by mouth daily.      Marland Kitchen METOPROLOL SUCCINATE ER 50 MG PO TB24 Oral Take 50 mg by mouth daily.      Lanetta Inch BI-FLEX JOINT SHIELD PO TABS Oral Take 1 tablet by mouth daily.      . CENTRUM PO CHEW Oral Chew 1 tablet by mouth daily.      Marland Kitchen NITROGLYCERIN 0.4 MG SL SUBL Sublingual Place 1 tablet (0.4 mg total) under the tongue every 5 (five) minutes as needed for chest pain. 25 tablet 12  . POTASSIUM CHLORIDE 10 MEQ PO TBCR  TWO TABLETS DAILY    . PRAVASTATIN SODIUM 40 MG PO TABS Oral Take 1 tablet (40 mg total) by mouth every evening. 90 tablet 4  . VITAMIN B-12 1000 MCG PO TABS Oral Take 1,000 mcg by mouth daily.      Marland Kitchen VITAMIN E 100 UNITS PO CAPS Oral Take 100 Units by mouth daily.        BP 118/56  Pulse 94  Temp 98.4 F (36.9 C) (Oral)  Resp 24  SpO2 98%  Physical Exam  Constitutional: She is oriented to person, place, and time. She appears well-developed and well-nourished. No distress.  HENT:  Head: Normocephalic and atraumatic.  Nose: Nose normal.  Mouth/Throat: Oropharynx is clear and moist.  Eyes: EOM are normal. Pupils are equal, round, and reactive to light.  Neck: Normal range of motion. Neck supple. No tracheal deviation present.  Cardiovascular: Normal rate, regular rhythm and intact distal pulses.   Murmur (aortic outflow murmur) heard. Pulmonary/Chest: Effort normal. She has rales (left lower lobe).  Abdominal: Soft. Bowel sounds are normal. She exhibits no distension. There is no tenderness. There is no rebound and no guarding.  Musculoskeletal: Normal range of motion. She exhibits no tenderness.  Neurological: She is alert and oriented to person, place, and time.  Skin: Skin is warm and dry. No rash noted.  Psychiatric: She has a normal mood and affect. Her behavior is normal.    ED Course  Procedures (including critical care time)  Results for orders placed during the  hospital encounter of 05/01/12  CBC      Component Value Range   WBC 8.5  4.0 - 10.5 K/uL   RBC 3.64 (*) 3.87 - 5.11 MIL/uL   Hemoglobin 12.3  12.0 - 15.0 g/dL   HCT 08.6  57.8 - 46.9 %   MCV 103.3 (*) 78.0 - 100.0 fL   MCH 33.8  26.0 - 34.0 pg   MCHC 32.7  30.0 - 36.0 g/dL  RDW 13.1  11.5 - 15.5 %   Platelets 158  150 - 400 K/uL  BASIC METABOLIC PANEL      Component Value Range   Sodium 136  135 - 145 mEq/L   Potassium 3.8  3.5 - 5.1 mEq/L   Chloride 100  96 - 112 mEq/L   CO2 26  19 - 32 mEq/L   Glucose, Bld 119 (*) 70 - 99 mg/dL   BUN 13  6 - 23 mg/dL   Creatinine, Ser 0.98  0.50 - 1.10 mg/dL   Calcium 9.2  8.4 - 11.9 mg/dL   GFR calc non Af Amer 72 (*) >90 mL/min   GFR calc Af Amer 83 (*) >90 mL/min  POCT I-STAT TROPONIN I      Component Value Range   Troponin i, poc 2.06 (*) 0.00 - 0.08 ng/mL   Comment NOTIFIED PHYSICIAN     Comment 3           PROTIME-INR      Component Value Range   Prothrombin Time 13.1  11.6 - 15.2 seconds   INR 1.00  0.00 - 1.49  APTT      Component Value Range   aPTT 29  24 - 37 seconds  TROPONIN I      Component Value Range   Troponin I 1.86 (*) <0.30 ng/mL  PRO B NATRIURETIC PEPTIDE      Component Value Range   Pro B Natriuretic peptide (BNP) 978.4 (*) 0 - 450 pg/mL    DG Chest Port 1 View (Final result)   Result time:05/01/12 1248    Final result by Rad Results In Interface (05/01/12 12:48:30)    Narrative:   *RADIOLOGY REPORT*  Clinical Data: Cough and shortness of breath  PORTABLE CHEST - 1 VIEW  Comparison: 07/01/2011  Findings: Heart size is normal. There is no pleural effusion or edema. Lung volumes are low. Right lung base opacity is identified, new from previous exam. Advanced osteoarthritis involves the left glenohumeral joint.  IMPRESSION:  1. New right lung base opacity. Cannot rule out pneumonia.   Original Report Authenticated By: Rosealee Albee, M.D.    EKG: normal EKG, normal sinus rhythm, nonspecific  ST and T waves changes.   MDM    78 yo F in no acute distress, afebrile, vital signs stable, non toxic appearing who presents with  Sudden onset SOB and cough while flying on plane. Elevated troponin and BNP with new L sided opacity on CXR. Administered rocephin and azithromycin to cover for community acquired PNA.   4:26 PM  Discussed case with Dr. Lara Mulch.  Awaiting CTA PE study. If PE positive will plan to admit to medicine service other wise plan to discuss case with cardiology.        Nadara Mustard, MD 05/01/12 [phone]

## 2012-05-01 NOTE — ED Notes (Signed)
Granddaughter Victorino Dike [phone]

## 2012-05-01 NOTE — ED Notes (Signed)
To ED from PCP for cough and SOB since yesterday, PCP gave 0.6 Xopenex, EMS gave 5mg  [place], VSS, c/o dizzyness with exertion, VSS, NAD

## 2012-05-01 NOTE — ED Notes (Signed)
Results of troponin given to resident, [place]

## 2012-05-01 NOTE — Progress Notes (Signed)
Patient ID: Holly Gibson, Gibson   DOB: [birth year], 78 y.o.   MRN: [id number]   Patient ID: Holly Gibson MRN: [id number], DOB/AGE: 08-13-1918   Admit date: 05/01/2012   Primary Physician: Emeterio Reeve, MD Primary Cardiologist: Olga Millers  Pt. Profile: Holly Gibson is a 78 year old white Gibson with known coronary artery disease who presents with shortness of breath and cough. Her troponin was positive and we are asked to see her for evaluation and management.   Problem List  Past Medical History  Diagnosis Date  . Essential hypertension   . Coronary atherosclerosis 2005    Followed by Dr. Avel Sensor in [place], [place] previously.  Had LHC with occluded RCA in 2005 per patient report  . Pneumonia 2011  . Aortic stenosis   . Spinal stenosis 2008  . Polio   . Hypothyroidism     Past Surgical History  Procedure Date  . Joint replacement   . Shoulder debridement   . Tonsillectomy   . Appendectomy   . Cholecystectomy   . Total knee arthroplasty   . Thyroidectomy   . Thyroidectomy      Allergies  Allergies  Allergen Reactions  . Codeine Other (See Comments)    UNKNOWN    HPI  Holly 78 year old white Gibson with known coronary artery disease. She was admitted in December with a non-ST segment elevation MI. Cardiac catheterization showed stable anatomy for years with a totally occluded right coronary artery with good collaterals from the left system. There was no obstructive disease in the LAD the circumflex. Her left ventricular function is normal with an EF of 60-65% by 2-D echocardiography.  She was treated with medical therapy. She was on aspirin, beta blocker, and when necessary subungual nitroglycerin.  She developed a croupy cough yesterday morning after a flight from [place] to [place]. She denies fever chills or sweats. She then began to have a choking sensation in her throat today. She called her granddaughter who brought her to Dr. Laurine Blazer her primary  care physician. She sent her to the emergency room.  Here in the emergency room she hasn't infiltrate in the right base which is new. Troponin was positive at 1.86. A CT scan was ordered for ruling out pulmonary embolus. Was negative. He was by basilar atelectasis but no, no pneumonia. Her white blood cell count is normal.  Note that she presented with shortness of breath on last admission.  Home Medications  Prior to Admission medications   Medication Sig Start Date End Date Taking? Authorizing Provider  acetaminophen (TYLENOL ARTHRITIS PAIN) 650 MG CR tablet Take 650 mg by mouth every 8 (eight) hours as needed. For pain   Yes Historical Provider, MD  aspirin 81 MG chewable tablet Chew 81 mg by mouth daily.     Yes Historical Provider, MD  B Complex-C (B-COMPLEX WITH VITAMIN C) tablet Take 1 tablet by mouth daily.     Yes Historical Provider, MD  benazepril (LOTENSIN) 10 MG tablet Take 10 mg by mouth daily.    Yes Historical Provider, MD  cholecalciferol (VITAMIN D) 1000 UNITS tablet Take 1,000 Units by mouth daily.     Yes Historical Provider, MD  clobetasol cream (TEMOVATE) 0.05 % Apply 1 application topically 2 (two) times daily.   Yes Historical Provider, MD  clotrimazole-betamethasone (LOTRISONE) cream Apply 1 application topically 2 (two) times daily.   Yes Historical Provider, MD  docusate sodium (COLACE) 100 MG capsule Take 100 mg by mouth daily as needed. For constipation   Yes Historical  Provider, MD  famotidine (PEPCID) 10 MG tablet Take 10 mg by mouth daily.   Yes Historical Provider, MD  famotidine (PEPCID) 20 MG tablet Take 20 mg by mouth 2 (two) times daily.   Yes Historical Provider, MD  fexofenadine-pseudoephedrine (ALLEGRA-D) 60-120 MG per tablet Take 1 tablet by mouth 2 (two) times daily.   Yes Historical Provider, MD  furosemide (LASIX) 20 MG tablet Take 20 mg by mouth 2 (two) times daily.     Yes Historical Provider, MD  gelatin 650 MG capsule Take 650 mg by mouth daily.      Yes Historical Provider, MD  Glucosamine-Chondroit-Vit C-Mn (GLUCOSAMINE 1500 COMPLEX PO) Take 1 tablet by mouth daily.   Yes Historical Provider, MD  Lecithin 400 MG CAPS Take 1 capsule by mouth 2 (two) times daily.   Yes Historical Provider, MD  levalbuterol [facility] HFA) 45 MCG/ACT inhaler Inhale 1-2 puffs into the lungs every 6 (six) hours as needed. For wheezing   Yes Historical Provider, MD  levothyroxine (SYNTHROID, LEVOTHROID) 88 MCG tablet Take 88 mcg by mouth daily.     Yes Historical Provider, MD  lidocaine (LIDODERM) 5 % Place 1 patch onto the skin daily. Remove & Discard patch within 12 hours or as directed by MD   Yes Historical Provider, MD  Melatonin 3 MG TABS Take 1 tablet by mouth at bedtime.   Yes Historical Provider, MD  metoprolol (TOPROL-XL) 50 MG 24 hr tablet Take 50 mg by mouth daily.     Yes Historical Provider, MD  Misc Natural Products (OSTEO BI-FLEX JOINT SHIELD) TABS Take 1 tablet by mouth daily.     Yes Historical Provider, MD  multivitamin-iron-minerals-folic acid (CENTRUM) chewable tablet Chew 1 tablet by mouth daily.     Yes Historical Provider, MD  nitroGLYCERIN (NITROSTAT) 0.4 MG SL tablet Place 1 tablet (0.4 mg total) under the tongue every 5 (five) minutes as needed for chest pain. 07/04/11 07/03/12 Yes Rhonda G Barrett, PA  potassium chloride (KLOR-CON) 10 MEQ CR tablet TWO TABLETS DAILY   Yes Historical Provider, MD  pravastatin (PRAVACHOL) 40 MG tablet Take 1 tablet (40 mg total) by mouth every evening. 09/28/11 09/27/12 Yes Lewayne Bunting, MD  triamcinolone (KENALOG) 0.025 % cream Apply 1 application topically 2 (two) times daily.   Yes Historical Provider, MD  vitamin B-12 (CYANOCOBALAMIN) 1000 MCG tablet Take 1,000 mcg by mouth daily.     Yes Historical Provider, MD  vitamin E 100 UNIT capsule Take 100 Units by mouth daily.     Yes Historical Provider, MD    Family History  Family History  Problem Relation Age of Onset  . Coronary artery disease Father     . Coronary artery disease Brother   . Coronary artery disease Brother     Social History  History   Social History  . Marital Status: Widowed    Spouse Name: N/A    Number of Children: N/A  . Years of Education: N/A   Occupational History  . Not on file.   Social History Main Topics  . Smoking status: Former Smoker    Types: Cigarettes    Quit date: 07/30/1962  . Smokeless tobacco: Never Used  . Alcohol Use: 16.8 oz/week    14 Glasses of wine, 14 Shots of liquor per week  . Drug Use: No  . Sexually Active: Not on file   Other Topics Concern  . Not on file   Social History Narrative  . No narrative on  file     Review of Systems General:  No chills, fever, night sweats or weight changes.  Cardiovascular:  No chest pain, dyspnea on exertion, edema, orthopnea, palpitations, paroxysmal nocturnal dyspnea. Dermatological: No rash, lesions/masses Respiratory: No cough, dyspnea Urologic: No hematuria, dysuria Abdominal:   No nausea, vomiting, diarrhea, bright red blood per rectum, melena, or hematemesis Neurologic:  No visual changes, wkns, changes in mental status. All other systems reviewed and are otherwise negative except as noted above.  Physical Exam  Blood pressure 100/47, pulse 82, temperature 98.5 F (36.9 C), temperature source Oral, resp. rate 22, SpO2 100.00%.  General: Pleasant, NAD Psych: Normal affect. Neuro: Alert and oriented X 3. Moves all extremities spontaneously. HEENT: Normal  Neck: Supple without bruits or JVD. Lungs:  Resp regular and unlabored, CTA. Heart: RRR no s3, s4, or murmurs. Abdomen: Soft, non-tender, non-distended, BS + x 4.  Extremities: No clubbing, cyanosis or edema. DP/PT/Radials 2+ and equal bilaterally.  Labs   Basename 05/01/12 1327  CKTOTAL --  CKMB --  TROPONINI 1.86*   Lab Results  Component Value Date   WBC 8.5 05/01/2012   HGB 12.3 05/01/2012   HCT 37.6 05/01/2012   MCV 103.3* 05/01/2012   PLT 158 05/01/2012     Lab 05/01/12 1247  NA 136  K 3.8  CL 100  CO2 26  BUN 13  CREATININE 0.75  CALCIUM 9.2  PROT --  BILITOT --  ALKPHOS --  ALT --  AST --  GLUCOSE 119*   Lab Results  Component Value Date   CHOL 186 02/19/2012   HDL 76.30 02/19/2012   LDLCALC 94 02/19/2012   TRIG 78.0 02/19/2012   Lab Results  Component Value Date   DDIMER 0.87* 07/02/2011     Radiology/Studies  Ct Angio Chest Pe W/cm &/or Wo Cm  05/01/2012  *RADIOLOGY REPORT*  Clinical Data: Chest pain, shortness of breath, cough, recent flight, evaluate for pulmonary embolism  CT ANGIOGRAPHY CHEST  Technique:  Multidetector CT imaging of the chest using the standard protocol during bolus administration of intravenous contrast. Multiplanar reconstructed images including MIPs were obtained and reviewed to evaluate the vascular anatomy.  Contrast:  100 ml Omnipaque 350  Comparison: Chest radiograph - earlier same day  Vascular Findings:  There is adequate opacification of the pulmonary arteries with the main pulmonary artery measuring 292 HU.  There is no definitive filling defect within the pulmonary arterial tree to the level of the bilateral segmental pulmonary arteries.  Evaluation of the distal subsegmental pulmonary arteries is degraded secondary to patient respiratory artifact.  Normal caliber of the main pulmonary artery.  Cardiomegaly.  Extensive calcifications within the coronary arteries.  Extensive calcifications within the mitral valve annulus and aortic valve.  No pericardial effusion.  Scattered atherosclerotic calcifications of a normal caliber thoracic aorta.  There are extensive atherosclerotic calcifications involving the origin of proximal aspect of the left subclavian artery.  Eccentric mural calcification is seen within the distal aspect of the imaged thoracic aorta at the level of the diaphragmatic hiatus (image 74, series four).  Nonvascular findings:  Evaluation of the pulmonary parenchyma is degraded secondary to  patient respiratory artifact.  Symmetric dependent ground-glass opacities favored to represent atelectasis.  No focal airspace opacities.  No definite pleural effusion or pneumothorax.  There is mild to moderate collapse of the central airways.  No mediastinal, hilar or axillary lymphadenopathy.  Incidental imaging of the upper abdomen demonstrates a small hiatal hernia.  No acute or aggressive  osseous abnormalities.  Accentuated thoracic kyphosis.  Multilevel thoracic spine degenerative change.  IMPRESSION:  1.  Negative for pulmonary embolism to the level of the bilateral segmental pulmonary arteries.  2.  Cardiomegaly.  Extensive coronary artery calcifications. Calcifications within the mitral annulus and aortic valve.  3. Atherosclerotic calcifications within a normal caliber thoracic aorta.  4.  Dependent basilar opacities favored to represent atelectasis.  5.  Mild collapse of the central airways, nonspecific but may be seen in the setting of airways disease and/or bronchomalacia.   Original Report Authenticated By: Waynard Reeds, M.D.    Dg Chest Port 1 View  05/01/2012  *RADIOLOGY REPORT*  Clinical Data: Cough and shortness of breath  PORTABLE CHEST - 1 VIEW  Comparison: 07/01/2011  Findings: Heart size is normal.  There is no pleural effusion or edema.  Lung volumes are low. Right lung base opacity is identified, new from previous exam. Advanced osteoarthritis involves the left glenohumeral joint.  IMPRESSION:  1.  New right lung base opacity.  Cannot rule out pneumonia.   Original Report Authenticated By: Rosealee Albee, M.D.     ECG  Normal sinus rhythm, no acute ST segment changes.  ASSESSMENT AND PLAN  Holly Gibson is a Holly 78 year old white Gibson who has stable coronary anatomy with a totally occluded right coronary artery with good collaterals. This was by cardiac catheterization in December of 2012. She has normal left ventricular systolic function. She presents now with acute  bronchitis with possible pneumonia. Her troponin is positive suggesting a non-ST segment elevation versus demand ischemia. She will be admitted overnight after discussion with her granddaughter and her for observation. We will check serial cardiac markers and began antibiotic therapy. And also start her on oral nitrates and low-dose to improve collateral blood flow to her distal right coronary artery. I discussed this at length with her and her granddaughter. They are comfortable with this plan. I'll let Dr. Jens Som know.   Signed, Valera Castle, MD 05/01/2012, @NOW

## 2012-05-02 ENCOUNTER — Observation Stay (HOSPITAL_COMMUNITY): Payer: Medicare Other

## 2012-05-02 ENCOUNTER — Telehealth: Payer: Self-pay | Admitting: Cardiology

## 2012-05-02 DIAGNOSIS — I2510 Atherosclerotic heart disease of native coronary artery without angina pectoris: Secondary | ICD-10-CM

## 2012-05-02 DIAGNOSIS — I214 Non-ST elevation (NSTEMI) myocardial infarction: Secondary | ICD-10-CM

## 2012-05-02 LAB — COMPREHENSIVE METABOLIC PANEL
Albumin: 3.4 g/dL — ABNORMAL LOW (ref 3.5–5.2)
BUN: 11 mg/dL (ref 6–23)
Chloride: 103 mEq/L (ref 96–112)
Creatinine, Ser: 0.75 mg/dL (ref 0.50–1.10)
GFR calc non Af Amer: 72 mL/min — ABNORMAL LOW (ref >90)
Total Bilirubin: 0.4 mg/dL (ref 0.3–1.2)

## 2012-05-02 LAB — TROPONIN I: Troponin I: 1.58 ng/mL (ref <0.30)

## 2012-05-02 MED ORDER — HEPARIN SODIUM (PORCINE) 5000 UNIT/ML IJ SOLN
5000.0000 [IU] | Freq: Three times a day (TID) | INTRAMUSCULAR | Status: DC
  Administered 2012-05-02 – 2012-05-03 (×3): 5000 [IU] via SUBCUTANEOUS
  Filled 2012-05-02 (×6): qty 1

## 2012-05-02 NOTE — ED Provider Notes (Signed)
I saw and evaluated the patient, reviewed the resident's note and I agree with the findings and plan.  ECG shows NSR, normal intervals, no specific ST and T wave abn's.  Interpret as abn ECG.    Pt with sudden onset of CP a few days ago, recent air travel.  Troponin +.  No STEMI.  Either ACS but need to r/o PE with CT scan.  Pt is hemodynically stable, will start IV heparin.  O2 sats are adequate.  Signed out to Dr. Deretha Emory for follow up on CT and consultation to appropriate service for admission.  Gavin Pound. Janani Chamber, MD 05/06/12 7829

## 2012-05-02 NOTE — Telephone Encounter (Signed)
New problem  Grand-daughter calling to get an update of her condition.

## 2012-05-02 NOTE — Progress Notes (Signed)
Pt extremely anxious and expressing fears of being here and being afraid to go to sleep. Stayed with pt to try and comfort her. Called pt's grandaughter and asked her to talk with the pt to see if she could help to calm the pt's fears. Grandaughter talked with pt and tried to reassure her that she was safe.

## 2012-05-02 NOTE — Progress Notes (Signed)
@   Subjective:  Denies CP or dyspnea   Objective:  Filed Vitals:   05/01/12 1927 05/01/12 2125 05/02/12 0244 05/02/12 0519  BP: 119/55 125/50  123/61  Pulse: 78 73  71  Temp: 98.3 F (36.8 C) 98.2 F (36.8 C)  97.7 F (36.5 C)  TempSrc: Oral Oral  Oral  Resp: 15 20  18   Height:  5' (1.524 m)    Weight:  190 lb 4.1 oz (86.3 kg) 188 lb 7.9 oz (85.5 kg)   SpO2: 97% 93%  96%    Intake/Output from previous day:  Intake/Output Summary (Last 24 hours) at 05/02/12 0753 Last data filed at 05/01/12 2002  Gross per 24 hour  Intake      0 ml  Output    500 ml  Net   -500 ml    Physical Exam: Physical exam: Well-developed well-nourished in no acute distress.  Skin is warm and dry.  HEENT is normal.  Neck is supple.  Chest is clear to auscultation with normal expansion.  Cardiovascular exam is regular rate and rhythm. 2/6 systolic murmur LSB Abdominal exam nontender or distended. No masses palpated. Extremities show no edema. neuro grossly intact    Lab Results: Basic Metabolic Panel:  Basename 05/02/12 0244 05/01/12 1247  NA 139 136  K 3.6 3.8  CL 103 100  CO2 25 26  GLUCOSE 102* 119*  BUN 11 13  CREATININE 0.75 0.75  CALCIUM 9.1 9.2  MG -- --  PHOS -- --   CBC:  Basename 05/01/12 1247  WBC 8.5  NEUTROABS --  HGB 12.3  HCT 37.6  MCV 103.3*  PLT 158   Cardiac Enzymes:  Basename 05/02/12 0244 05/01/12 2140 05/01/12 1834 05/01/12 1327  CKTOTAL -- -- 155 --  CKMB -- -- 5.4* --  CKMBINDEX -- -- -- --  TROPONINI 1.57* 1.09* -- 1.86*     Assessment/Plan:  1) NSTEMI - most likely demand ischemia; plan medical therapy. Continue ASA, toprol and statin.  2) Pneumonia - continue antibiotics for 10 days. Chest CT showed no PE. 3) CAD - Continue ASA and statin; will not add nitrates. 4) AS - will plan fu echos in the future Probable DC in AM and FU with me 2-4 weeks.  Olga Millers 05/02/2012, 7:53 AM

## 2012-05-02 NOTE — Telephone Encounter (Signed)
Spoke with pt granddtr, aware pt is being tx for bronchitis with antibiotics. She will get up today and ambulate. If she has no problems today then she will be able to go home tomorrow.

## 2012-05-03 ENCOUNTER — Encounter (HOSPITAL_COMMUNITY): Payer: Self-pay | Admitting: Cardiology

## 2012-05-03 DIAGNOSIS — J40 Bronchitis, not specified as acute or chronic: Secondary | ICD-10-CM

## 2012-05-03 DIAGNOSIS — I359 Nonrheumatic aortic valve disorder, unspecified: Secondary | ICD-10-CM

## 2012-05-03 DIAGNOSIS — R7989 Other specified abnormal findings of blood chemistry: Secondary | ICD-10-CM

## 2012-05-03 NOTE — Progress Notes (Signed)
[facility] discharged to [facility]. Discharge instructions reviewed with patient and granddaughter. No new Rx's generated. Patient to continue previous home medications. All questions answered to patient's satisfaction.  Patient left via w/c to pt pick up at [facility]. Harlow Asa

## 2012-05-03 NOTE — Progress Notes (Signed)
Holly Gibson  78 y.o.  female  Subjective: Patient feels fine. Cough has resolved. Denies chest pain or dyspnea. Impression is that she simply "overdid it" on her recent six-day trip to [place].  Allergy: Codeine  Objective: Vital signs in last 24 hours: Temp:  [97.4 F (36.3 C)-98.5 F (36.9 C)] 98.5 F (36.9 C) (10/05 0449) Pulse Rate:  [69-87] 69  (10/05 0449) Resp:  [18-20] 18  (10/05 0449) BP: (115-150)/(53-64) 150/53 mmHg (10/05 0449) SpO2:  [93 %-96 %] 93 % (10/05 0449) Weight:  [87.136 kg (192 lb 1.6 oz)] 87.136 kg (192 lb 1.6 oz) (10/05 0449)  87.136 kg (192 lb 1.6 oz) Body mass index is 37.52 kg/(m^2).  Weight change: 0.836 kg (1 lb 13.5 oz) Last BM Date: 04/30/12  Intake/Output from previous day: 10/04 0701 - 10/05 0700 In: 840 [P.O.:840] Out: 850 [Urine:850] Total I&O for admission: -510 cc  General- Well developed; no acute distress; mildly overweight Neck- No JVD, transmitted murmur; normal carotid upstrokes Lungs- minimal right basilar rales; normal I:E ratio; mild kyphosis Cardiovascular- normal PMI; normal S1; preserved A2; grade 2-3/6 early to mid peaking systolic ejection murmur radiating to the carotids. Abdomen- normal bowel sounds; soft and non-tender without masses or organomegaly Skin- Warm, no significant lesions Extremities-1/2+ edema; tenderness to palpation; mild chronic stasis changes  Lab Results: Cardiac Markers:   Basename 05/02/12 0825 05/02/12 0244  TROPONINI 1.58* 1.57*   CBC:   Basename 05/01/12 1247  WBC 8.5  HGB 12.3  HCT 37.6  PLT 158   BMET:  Basename 05/02/12 0244 05/01/12 1247  NA 139 136  K 3.6 3.8  CL 103 100  CO2 25 26  GLUCOSE 102* 119*  BUN 11 13  CREATININE 0.75 0.75  CALCIUM 9.1 9.2   Hepatic Function:   Basename 05/02/12 0244  PROT 6.6  ALBUMIN 3.4*  AST 31  ALT 19  ALKPHOS 63  BILITOT 0.4  BILIDIR --  IBILI --   GFR:  Estimated Creatinine Clearance: 44.9 ml/min (by C-G formula based on Cr of  0.75).  Imaging Studies/Results: X-ray Chest 05/02/2012: Mild basilar atelectasis, mild cardiomegaly, no infiltrate identified. CT of chest 05/01/12: No pulmonary embolism, extensive coronary calcification, atelectasis, possible bronchomalacia Echocardiogram in 06/2011: Normal EF, mild LVH, moderate LAE, mild to moderate AS Cardiac catheterization in 06/2011: stable anatomy; 100% right coronary artery with good collaterals from the left system and no significant LAD or circumflex obstruction  Medications:  I have reviewed the patient's current medications. Scheduled:   . aspirin  81 mg Oral Daily  . B-complex with vitamin C  1 tablet Oral Daily  . benazepril  10 mg Oral Daily  . cholecalciferol  1,000 Units Oral Daily  . clobetasol cream  1 application Topical BID  . famotidine  20 mg Oral BID  . furosemide  20 mg Oral BID  . heparin subcutaneous  5,000 Units Subcutaneous Q8H  . levothyroxine  88 mcg Oral Daily  . lidocaine  1 patch Transdermal Q24H  . loratadine  10 mg Oral Daily  . metoprolol succinate  50 mg Oral Daily  . multivitamin  5 mL Oral Daily  . potassium chloride  20 mEq Oral Daily  . simvastatin  20 mg Oral q1800  . sodium chloride  3 mL Intravenous Q12H  . vitamin B-12  1,000 mcg Oral Daily  . vitamin E  100 Units Oral Daily   Active Problems:  NSTEMI (non-ST elevated myocardial infarction)  CAD (coronary artery disease)  Aortic stenosis  Hypertension  Edema  Elevated troponin  Bronchitis   Assessment/Plan: Acute bronchitis: Appears to be resolving without antibiotic therapy. Chronic elevation of troponin: Unclear etiology; not related to renal function; possible ongoing mild myocarditis or cardiomyopathy; appears to be asymptomatic and can be further evaluated as an outpatient. Aortic stenosis: Mild to moderate by exam; likely will not require intervention in the future Coronary artery disease-single vessel as of cardiac catheterization 10 months ago; no  clinical evidence for progression of disease  Patient doing well with no symptoms and without identification of serious underlying acute medical problems. Okay for discharge on usual medication. Return office visit with Dr. Jens Som in one month.    LOS: 2 days   Lisbon Bing 05/03/2012, 8:01 AM

## 2012-05-03 NOTE — Discharge Summary (Signed)
Discharge Summary   Patient ID: Holly Gibson MRN: [id number], DOB/AGE: June 03, 1919 78 y.o.  Primary MD: Emeterio Reeve, MD Primary Cardiologist: Dr. Jens Som Admit date: 05/01/2012 D/C date:     05/03/2012      Primary Discharge Diagnoses:  1. Acute bronchitis  - resolving without antibiotic therapy  2. NSTEMI  - most likely demand ischemia; plan medical therapy  3. Aortic stenosis  - Mild to moderate by exam  - F/u echo in the future  4. Coronary artery disease  - no clinical evidence for progression of disease  Secondary Discharge Diagnoses:  . Essential hypertension   . Spinal stenosis 2008  . Polio 1931    "made my left arm weak since" (05/01/2012)  . Hypothyroidism   . Pneumonia 1923; 1959; 1981; 2011  . Heart murmur 2000  . B12 deficiency anemia   . Carotid artery stenosis 2005    right  . Shortness of breath     "all the time right now" (05/01/2012)  . Complication of anesthesia     "I panic" (05/01/2012)  . High cholesterol   . Varicose veins     BLE "since the 1940's" (05/01/2012)  . Chronic bronchitis     "~ q winter" (05/01/2012)  . GERD (gastroesophageal reflux disease)   . Arthritis     "all over my body" (05/01/2012)     Allergies Allergies  Allergen Reactions  . Codeine Anaphylaxis, Nausea And Vomiting and Rash    Diagnostic Studies/Procedures:   05/01/2012 - Ct Angio Chest Pe W/cm &/or Wo Cm Vascular Findings:  There is adequate opacification of the pulmonary arteries with the main pulmonary artery measuring 292 HU.  There is no definitive filling defect within the pulmonary arterial tree to the level of the bilateral segmental pulmonary arteries.  Evaluation of the distal subsegmental pulmonary arteries is degraded secondary to patient respiratory artifact.  Normal caliber of the main pulmonary artery.  Cardiomegaly.  Extensive calcifications within the coronary arteries.  Extensive calcifications within the mitral valve annulus and aortic valve.   No pericardial effusion.  Scattered atherosclerotic calcifications of a normal caliber thoracic aorta.  There are extensive atherosclerotic calcifications involving the origin of proximal aspect of the left subclavian artery.  Eccentric mural calcification is seen within the distal aspect of the imaged thoracic aorta at the level of the diaphragmatic hiatus (image 74, series four).  Nonvascular findings:  Evaluation of the pulmonary parenchyma is degraded secondary to patient respiratory artifact.  Symmetric dependent ground-glass opacities favored to represent atelectasis.  No focal airspace opacities.  No definite pleural effusion or pneumothorax.  There is mild to moderate collapse of the central airways.  No mediastinal, hilar or axillary lymphadenopathy.  Incidental imaging of the upper abdomen demonstrates a small hiatal hernia.  No acute or aggressive osseous abnormalities.  Accentuated thoracic kyphosis.  Multilevel thoracic spine degenerative change.  IMPRESSION:  1.  Negative for pulmonary embolism to the level of the bilateral segmental pulmonary arteries.  2.  Cardiomegaly.  Extensive coronary artery calcifications. Calcifications within the mitral annulus and aortic valve.  3. Atherosclerotic calcifications within a normal caliber thoracic aorta.  4.  Dependent basilar opacities favored to represent atelectasis.  5.  Mild collapse of the central airways, nonspecific but may be seen in the setting of airways disease and/or bronchomalacia.    History of Present Illness: 78 y.o. female w/ the above medical problems who presented to [facility] on 05/01/12 with complaints of shortness of breath and  cough. She was admitted in December with a NSTEMI. Cath showed stable anatomy for years with a totally occluded RCA with good collaterals from the left system and no obstructive disease in the LAD the circumflex. She was treated medically. On day of presentation she complained of shortness of breath  and cough after returning from trip to [facility] prompting her to seek medical evaluation.  Hospital Course: EKG revealed NSR with no acute ST/T changes. CXR showed new right lung base opacity. Labs were significant for troponin 1.86, DDimer 0.87, unremarkable CBC/BMET. CTA chest was without PE or infiltrate. She was given one dose of abx in the ED for concern of PNA, but CT did not show PNA and she was afebrile without leukocytosis, abx were not continued. She was admitted for further evaluation and treatment. Repeat CXR showed minimal basilar atelectasis and stable cardiomegaly. Cardiac enzymes were cycled and trended down. Her shortness of breath and cough resolved and she was feeling well. Impression is that she simply "overdid it" on her recent six-day trip to [place]. She was seen and evaluated by Dr. Dietrich Pates who felt she was stable for discharge home with plans for follow up as scheduled below.  Discharge Vitals: Blood pressure 150/53, pulse 69, temperature 98.5 F (36.9 C), temperature source Oral, resp. rate 18, height 5' (1.524 m), weight 192 lb 1.6 oz (87.136 kg), SpO2 93.00%.  Labs: Component Value Date   WBC 8.5 05/01/2012   HGB 12.3 05/01/2012   HCT 37.6 05/01/2012   MCV 103.3* 05/01/2012   PLT 158 05/01/2012    Lab 05/02/12 0244  NA 139  K 3.6  CL 103  CO2 25  BUN 11  CREATININE 0.75  CALCIUM 9.1  PROT 6.6  BILITOT 0.4  ALKPHOS 63  ALT 19  AST 31  GLUCOSE 102*   Basename 05/02/12 0825 05/02/12 0244 05/01/12 2140 05/01/12 1834 05/01/12 1327  CKTOTAL -- -- -- 155 --  CKMB -- -- -- 5.4* --  TROPONINI 1.58* 1.57* 1.09* -- 1.86*     05/01/2012 13:27  Pro B Natriuretic peptide (BNP) 978.4 (H)    Discharge Medications     Medication List     As of 05/03/2012  9:20 AM    TAKE these medications         aspirin 81 MG chewable tablet   Chew 81 mg by mouth daily.      B-complex with vitamin C tablet   Take 1 tablet by mouth daily.      benazepril 10 MG tablet    Commonly known as: LOTENSIN   Take 10 mg by mouth daily.      cholecalciferol 1000 UNITS tablet   Commonly known as: VITAMIN D   Take 1,000 Units by mouth daily.      clobetasol cream 0.05 %   Commonly known as: TEMOVATE   Apply 1 application topically 2 (two) times daily.      docusate sodium 100 MG capsule   Commonly known as: COLACE   Take 100 mg by mouth daily as needed. For constipation      famotidine 20 MG tablet   Commonly known as: PEPCID   Take 20 mg by mouth 2 (two) times daily.      fexofenadine-pseudoephedrine 60-120 MG per tablet   Commonly known as: ALLEGRA-D   Take 1 tablet by mouth 2 (two) times daily.      furosemide 20 MG tablet   Commonly known as: LASIX   Take 20 mg by  mouth 2 (two) times daily.      gelatin 650 MG capsule   Take 650 mg by mouth daily.      GLUCOSAMINE 1500 COMPLEX PO   Take 1 tablet by mouth daily.      Lecithin 400 MG Caps   Take 1 capsule by mouth 2 (two) times daily.      levalbuterol 45 MCG/ACT inhaler   Commonly known as: XOPENEX HFA   Inhale 1-2 puffs into the lungs every 6 (six) hours as needed. For wheezing      levothyroxine 88 MCG tablet   Commonly known as: SYNTHROID, LEVOTHROID   Take 88 mcg by mouth daily.      lidocaine 5 %   Commonly known as: LIDODERM   Place 1 patch onto the skin daily. Remove & Discard patch within 12 hours or as directed by MD      LOTRISONE cream   Generic drug: clotrimazole-betamethasone   Apply 1 application topically 2 (two) times daily.      Melatonin 3 MG Tabs   Take 1 tablet by mouth at bedtime.      metoprolol succinate 50 MG 24 hr tablet   Commonly known as: TOPROL-XL   Take 50 mg by mouth daily.      multivitamin-iron-minerals-folic acid chewable tablet   Chew 1 tablet by mouth daily.      nitroGLYCERIN 0.4 MG SL tablet   Commonly known as: NITROSTAT   Place 1 tablet (0.4 mg total) under the tongue every 5 (five) minutes as needed for chest pain.      Osteo Bi-Flex  Joint Shield Tabs   Take 1 tablet by mouth daily.      potassium chloride 10 MEQ CR tablet   Commonly known as: KLOR-CON   TWO TABLETS DAILY      pravastatin 40 MG tablet   Commonly known as: PRAVACHOL   Take 1 tablet (40 mg total) by mouth every evening.      triamcinolone 0.025 % cream   Commonly known as: KENALOG   Apply 1 application topically 2 (two) times daily.      TYLENOL ARTHRITIS PAIN 650 MG CR tablet   Generic drug: acetaminophen   Take 650 mg by mouth every 8 (eight) hours as needed. For pain      vitamin B-12 1000 MCG tablet   Commonly known as: CYANOCOBALAMIN   Take 1,000 mcg by mouth daily.      vitamin E 100 UNIT capsule   Take 100 Units by mouth daily.         Disposition   Discharge Orders    Future Orders Please Complete By Expires   Diet - low sodium heart healthy      Increase activity slowly        Follow-up Information    Follow up with Olga Millers, MD. (Our office will call you with your appointment date/time)    Contact information:   [facility] [address] [place] [address] [phone]           Outstanding Labs/Studies:  None  Duration of Discharge Encounter: Greater than 30 minutes including physician and PA time.  Signed, Kemaya Dorner PA-C 05/03/2012, 9:20 AM

## 2012-05-15 ENCOUNTER — Emergency Department (HOSPITAL_COMMUNITY): Payer: Medicare Other

## 2012-05-15 ENCOUNTER — Encounter (HOSPITAL_COMMUNITY): Payer: Self-pay | Admitting: *Deleted

## 2012-05-15 ENCOUNTER — Emergency Department (HOSPITAL_COMMUNITY)
Admission: EM | Admit: 2012-05-15 | Discharge: 2012-05-15 | Disposition: A | Discharge status: Home / Self Care | Payer: Medicare Other | Attending: Emergency Medicine | Admitting: Emergency Medicine

## 2012-05-15 DIAGNOSIS — I10 Essential (primary) hypertension: Secondary | ICD-10-CM | POA: Insufficient documentation

## 2012-05-15 DIAGNOSIS — Z9089 Acquired absence of other organs: Secondary | ICD-10-CM | POA: Insufficient documentation

## 2012-05-15 DIAGNOSIS — Z7982 Long term (current) use of aspirin: Secondary | ICD-10-CM | POA: Insufficient documentation

## 2012-05-15 DIAGNOSIS — R079 Chest pain, unspecified: Secondary | ICD-10-CM | POA: Insufficient documentation

## 2012-05-15 DIAGNOSIS — Z79899 Other long term (current) drug therapy: Secondary | ICD-10-CM | POA: Insufficient documentation

## 2012-05-15 DIAGNOSIS — I2510 Atherosclerotic heart disease of native coronary artery without angina pectoris: Secondary | ICD-10-CM | POA: Insufficient documentation

## 2012-05-15 DIAGNOSIS — I252 Old myocardial infarction: Secondary | ICD-10-CM | POA: Insufficient documentation

## 2012-05-15 LAB — COMPREHENSIVE METABOLIC PANEL
ALT: 15 U/L (ref 0–35)
Albumin: 3.3 g/dL — ABNORMAL LOW (ref 3.5–5.2)
Alkaline Phosphatase: 61 U/L (ref 39–117)
Potassium: 4.3 mEq/L (ref 3.5–5.1)
Sodium: 137 mEq/L (ref 135–145)
Total Protein: 6.3 g/dL (ref 6.0–8.3)

## 2012-05-15 LAB — CBC
HCT: 36.4 % (ref 36.0–46.0)
MCHC: 33 g/dL (ref 30.0–36.0)
MCV: 102.5 fL — ABNORMAL HIGH (ref 78.0–100.0)
RDW: 12.9 % (ref 11.5–15.5)

## 2012-05-15 LAB — URINE MICROSCOPIC-ADD ON

## 2012-05-15 LAB — URINALYSIS, ROUTINE W REFLEX MICROSCOPIC
Bilirubin Urine: NEGATIVE
Hgb urine dipstick: NEGATIVE
Protein, ur: NEGATIVE mg/dL
Urobilinogen, UA: 0.2 mg/dL (ref 0.0–1.0)

## 2012-05-15 MED ORDER — ALBUTEROL SULFATE HFA 108 (90 BASE) MCG/ACT IN AERS
2.0000 | INHALATION_SPRAY | RESPIRATORY_TRACT | Status: DC | PRN
  Administered 2012-05-15: 2 via RESPIRATORY_TRACT
  Filled 2012-05-15: qty 6.7

## 2012-05-15 MED ORDER — ASPIRIN 81 MG PO CHEW
324.0000 mg | CHEWABLE_TABLET | Freq: Once | ORAL | Status: DC
  Filled 2012-05-15: qty 4

## 2012-05-15 NOTE — ED Notes (Signed)
Family at bedside.Grand daughter

## 2012-05-15 NOTE — ED Notes (Signed)
EMS called to transport PT because of CP ( pressure for 2 weeks) . Possible  Non- stemi  2 weeks ago per ems.

## 2012-05-15 NOTE — ED Provider Notes (Signed)
History     CSN: [id number]  Arrival date & time 05/15/12  1739   First MD Initiated Contact with Patient 05/15/12 1745      Chief Complaint  Patient presents with  . Chest Pain    pressure for 2 weeks    (Consider location/radiation/quality/duration/timing/severity/associated sxs/prior treatment) Patient is a 78 y.o. female presenting with chest pain.  Chest Pain Pertinent negatives for primary symptoms include no shortness of breath, no abdominal pain, no nausea and no vomiting.  Pertinent negatives for associated symptoms include no numbness and no weakness.    patient presents with chest pain. Described as pressure. She's had for the last couple weeks. She was recently in the hospital for a non-STEMI. She had similar pain at that time, however it resolved without term. The lightheadedness or dizziness. No cough. No fevers.  Past Medical History  Diagnosis Date  . Essential hypertension   . CAD (coronary artery disease) 2005    NSTEMI 06/2011, cath showed chronically occluded RCA with good collaterals from the left system and no obstructive disease in the LAD the circumflex, nl LV fxn  . Aortic stenosis   . Spinal stenosis 2008  . Polio 1931    "made my left arm weak since" (05/01/2012)  . Hypothyroidism   . Pneumonia 1923; 1959; 1981; 2011  . Heart murmur 2000  . B12 deficiency anemia   . Carotid artery stenosis 2005    right  . Shortness of breath     "all the time right now" (05/01/2012)  . Complication of anesthesia     "I panic" (05/01/2012)  . High cholesterol   . Varicose veins     BLE "since the 1940's" (05/01/2012)  . Myocardial infarction ~ 2011  . Chronic bronchitis     "~ q winter" (05/01/2012)  . GERD (gastroesophageal reflux disease)   . Arthritis     "all over my body" (05/01/2012)    Past Surgical History  Procedure Date  . Joint replacement   . Shoulder debridement   . Total knee arthroplasty 2000  . Thyroidectomy   . Bunionectomy 2004   "and big toe repair; right" (05/01/2012)  . Belpharoptosis repair 10/2007    bilaterally  . Tonsillectomy 1932  . Appendectomy 1940  . Ganglion cyst excision 1955    right hand  . Dilation and curettage of uterus 1969; 1974  . Cholecystectomy 1983  . Knee arthroscopy 1990    left  . Colonoscopy 1988; 1989; 1994; 1999    "all clear"  . Cataract extraction, bilateral [id number]  . Total hip arthroplasty 1999    right  . Cardiac catheterization 2006?    Family History  Problem Relation Age of Onset  . Coronary artery disease Father   . Coronary artery disease Brother   . Coronary artery disease Brother     History  Substance Use Topics  . Smoking status: Former Smoker -- 0.5 packs/day for 22 years    Types: Cigarettes    Quit date: 07/30/1962  . Smokeless tobacco: Never Used  . Alcohol Use: 8.4 oz/week    7 Glasses of wine, 7 Shots of liquor per week     05/01/2012 "2 drinks/day; wine or liquor"    OB History    Grav Para Term Preterm Abortions TAB SAB Ect Mult Living                  Review of Systems  Constitutional: Negative for activity change and appetite change.  HENT: Negative for neck stiffness.   Eyes: Negative for pain.  Respiratory: Negative for chest tightness and shortness of breath.   Cardiovascular: Positive for chest pain. Negative for leg swelling.  Gastrointestinal: Negative for nausea, vomiting, abdominal pain and diarrhea.  Genitourinary: Negative for flank pain.  Musculoskeletal: Negative for back pain.  Skin: Negative for rash.  Neurological: Negative for weakness, numbness and headaches.  Psychiatric/Behavioral: Negative for behavioral problems.    Allergies  Codeine  Home Medications   Current Outpatient Rx  Name Route Sig Dispense Refill  . ACETAMINOPHEN ER 650 MG PO TBCR Oral Take 1,300 mg by mouth 2 (two) times daily. For pain    . ASPIRIN 81 MG PO CHEW Oral Chew 81 mg by mouth daily.      . B COMPLEX-C PO TABS Oral Take 1 tablet  by mouth daily.      Marland Kitchen BENAZEPRIL HCL 10 MG PO TABS Oral Take 10 mg by mouth daily.     Marland Kitchen VITAMIN D 1000 UNITS PO TABS Oral Take 1,000 Units by mouth daily.      Marland Kitchen CLOTRIMAZOLE-BETAMETHASONE 1-0.05 % EX CREA Topical Apply 1 application topically 2 (two) times daily.    Marland Kitchen FAMOTIDINE 10 MG PO CHEW Oral Chew 10 mg by mouth daily.    Marland Kitchen FAMOTIDINE 20 MG PO TABS Oral Take 20 mg by mouth 2 (two) times daily.    Marland Kitchen FEXOFENADINE HCL 60 MG PO TABS Oral Take 60 mg by mouth 2 (two) times daily.    . FUROSEMIDE 20 MG PO TABS Oral Take 20 mg by mouth 2 (two) times daily.      Marland Kitchen GELATIN 650 MG PO CAPS Oral Take 650 mg by mouth daily.      . OSTEO BI-FLEX REGULAR STRENGTH PO Oral Take 1 capsule by mouth daily.    Marland Kitchen LECITHIN 400 MG PO CAPS Oral Take 1 capsule by mouth 2 (two) times daily.    Marland Kitchen LEVOTHYROXINE SODIUM 88 MCG PO TABS Oral Take 88 mcg by mouth daily.      Marland Kitchen LIDOCAINE 5 % EX PTCH Transdermal Place 1 patch onto the skin daily. Remove & Discard patch within 12 hours or as directed by MD    . MELATONIN 3 MG PO TABS Oral Take 3 mg by mouth at bedtime.     Marland Kitchen METOPROLOL SUCCINATE ER 50 MG PO TB24 Oral Take 50 mg by mouth every evening.     . ADULT MULTIVITAMIN W/MINERALS CH Oral Take 1 tablet by mouth daily. Centrum chewables    . NITROGLYCERIN 0.4 MG SL SUBL Sublingual Place 1 tablet (0.4 mg total) under the tongue every 5 (five) minutes as needed for chest pain. 25 tablet 12  . POTASSIUM CHLORIDE ER 10 MEQ PO TBCR Oral Take 20 mEq by mouth 2 (two) times daily. Take with Lasix (furosemide)    . PRAVASTATIN SODIUM 40 MG PO TABS Oral Take 1 tablet (40 mg total) by mouth every evening. 90 tablet 4  . VITAMIN B-12 1000 MCG PO TABS Oral Take 1,000 mcg by mouth daily.      Marland Kitchen VITAMIN E 100 UNITS PO CAPS Oral Take 100 Units by mouth daily.        BP 127/57  Pulse 62  Temp 98.7 F (37.1 C) (Oral)  Resp 18  SpO2 97%  Physical Exam  Nursing note and vitals reviewed. Constitutional: She is oriented to person,  place, and time. She appears well-developed and well-nourished.  HENT:  Head: Normocephalic and atraumatic.  Eyes: EOM are normal. Pupils are equal, round, and reactive to light.  Neck: Normal range of motion. Neck supple.  Cardiovascular: Normal rate, regular rhythm and normal heart sounds.   No murmur heard. Pulmonary/Chest: Effort normal and breath sounds normal. No respiratory distress. She has no wheezes. She has no rales.  Abdominal: Soft. Bowel sounds are normal. She exhibits no distension. There is no tenderness. There is no rebound and no guarding.  Musculoskeletal: Normal range of motion. She exhibits edema.       Mild bilateral LE pitting edema  Neurological: She is alert and oriented to person, place, and time. No cranial nerve deficit.  Skin: Skin is warm and dry.  Psychiatric: She has a normal mood and affect. Her speech is normal.    ED Course  Procedures (including critical care time)  Labs Reviewed  COMPREHENSIVE METABOLIC PANEL - Abnormal; Notable for the following:    Albumin 3.3 (*)     GFR calc non Af Amer 49 (*)     GFR calc Af Amer 57 (*)     All other components within normal limits  CBC - Abnormal; Notable for the following:    RBC 3.55 (*)     MCV 102.5 (*)     All other components within normal limits  URINALYSIS, ROUTINE W REFLEX MICROSCOPIC - Abnormal; Notable for the following:    APPearance HAZY (*)     Leukocytes, UA SMALL (*)     All other components within normal limits  URINE MICROSCOPIC-ADD ON - Abnormal; Notable for the following:    Squamous Epithelial / LPF FEW (*)     All other components within normal limits  TROPONIN I  URINE CULTURE   Dg Chest 2 View  05/15/2012  *RADIOLOGY REPORT*  Clinical Data: Chest pain.  CHEST - 2 VIEW  Comparison: Chest x-ray 05/02/2012.  Findings: Lung volumes are normal.  No consolidative airspace disease.  No pleural effusions.  Mild pulmonary vascular congestion, without frank pulmonary edema.  Heart size  is mildly enlarged.  Extensive calcifications of the mitral annulus. The patient is rotated to the right on today's exam, resulting in distortion of the mediastinal contours and reduced diagnostic sensitivity and specificity for mediastinal pathology. Atherosclerosis in the thoracic aorta.  IMPRESSION: 1.  Mild cardiomegaly with pulmonary venous congestion, but no frank pulmonary edema. 2.  Atherosclerosis. 3.  Severe mitral annular calcifications.   Original Report Authenticated By: Florencia Reasons, M.D.      1. Chest pain      Date: 05/15/2012  Rate: 74  Rhythm: normal sinus rhythm  QRS Axis: normal  Intervals: normal  ST/T Wave abnormalities: normal  Conduction Disutrbances:none  Narrative Interpretation:   Old EKG Reviewed: unchanged    MDM  Patient with chest tightness for the last 2 weeks. She did seen in admitted for a non-STEMI and bronchitis around 2 weeks ago. EKG is reassuring enzymes are negative. X-ray does not show pulmonary edema. Doubt severe cardiac cause for this. 2 weeks and normal in to be followed as an outpatient. I discussed with Dr. Elease Hashimoto.         Juliet Rude. Rubin Payor, MD 05/15/12 2045

## 2012-05-15 NOTE — ED Notes (Signed)
Family at bedside. 

## 2012-05-30 ENCOUNTER — Encounter: Payer: Medicare Other | Admitting: Cardiology

## 2012-06-05 ENCOUNTER — Encounter: Payer: Self-pay | Admitting: Cardiology

## 2012-06-05 ENCOUNTER — Ambulatory Visit (INDEPENDENT_AMBULATORY_CARE_PROVIDER_SITE_OTHER): Payer: Medicare Other | Admitting: Cardiology

## 2012-06-05 VITALS — BP 130/73 | HR 87 | Wt 184.0 lb

## 2012-06-05 DIAGNOSIS — I10 Essential (primary) hypertension: Secondary | ICD-10-CM

## 2012-06-05 DIAGNOSIS — I350 Nonrheumatic aortic (valve) stenosis: Secondary | ICD-10-CM

## 2012-06-05 DIAGNOSIS — I359 Nonrheumatic aortic valve disorder, unspecified: Secondary | ICD-10-CM

## 2012-06-05 DIAGNOSIS — E785 Hyperlipidemia, unspecified: Secondary | ICD-10-CM

## 2012-06-05 DIAGNOSIS — R7989 Other specified abnormal findings of blood chemistry: Secondary | ICD-10-CM

## 2012-06-05 NOTE — Progress Notes (Signed)
HPI: Pleasant female for fu of CAD and AS. Cardiac catheterization in December of 2012 revealed minor irregularities in the left main. There is a 25% LAD. There were proximal luminal irregularities in the circumflex. The right coronary artery was noted to be occluded. There was collateralization from the LAD and circumflex. The ejection fraction was 65%. Medical therapy recommended. VQ scan was very low probability. Echocardiogram in December of 2012 showed an ejection fraction of 55-60%. There was grade 1 diastolic dysfunction. There was moderate aortic stenosis with a mean gradient of 23 mm of mercury. The left atrium was moderately dilated. Recently admitted with dyspnea. Chest CT showed no pulmonary embolus. There was extensive coronary artery calcifications. Patient had an elevated troponin of 1.86. This was felt to be demand ischemia and was treated medically. She was placed on antibiotics for probable pneumonia. Since discharge, the patient denies any dyspnea on exertion, orthopnea, PND, pedal edema, palpitations, syncope or chest pain.    Current Outpatient Prescriptions  Medication Sig Dispense Refill  . acetaminophen (TYLENOL ARTHRITIS PAIN) 650 MG CR tablet Take 1,300 mg by mouth 2 (two) times daily. For pain      . aspirin 81 MG chewable tablet Chew 81 mg by mouth daily.        . B Complex-C (B-COMPLEX WITH VITAMIN C) tablet Take 1 tablet by mouth daily.        . benazepril (LOTENSIN) 10 MG tablet Take 10 mg by mouth daily.       . cholecalciferol (VITAMIN D) 1000 UNITS tablet Take 1,000 Units by mouth daily.        . clotrimazole-betamethasone (LOTRISONE) cream Apply 1 application topically 2 (two) times daily.      . famotidine (PEPCID) 20 MG tablet Take 20 mg by mouth 2 (two) times daily.      . fexofenadine (ALLEGRA) 60 MG tablet Take 60 mg by mouth 2 (two) times daily.      . furosemide (LASIX) 20 MG tablet Take 20 mg by mouth 2 (two) times daily.        Marland Kitchen gelatin 650 MG capsule  Take 650 mg by mouth daily.        . Glucosamine-Chondroitin (OSTEO BI-FLEX REGULAR STRENGTH PO) Take 1 capsule by mouth daily.      . Lecithin 400 MG CAPS Take 1 capsule by mouth 2 (two) times daily.      Marland Kitchen levothyroxine (SYNTHROID, LEVOTHROID) 88 MCG tablet Take 88 mcg by mouth daily.        Marland Kitchen lidocaine (LIDODERM) 5 % Place 1 patch onto the skin daily. Remove & Discard patch within 12 hours or as directed by MD      . Melatonin 3 MG TABS Take 3 mg by mouth at bedtime.       . metoprolol (TOPROL-XL) 50 MG 24 hr tablet Take 50 mg by mouth every evening.       . Multiple Vitamin (MULTIVITAMIN WITH MINERALS) TABS Take 1 tablet by mouth daily. Centrum chewables      . nitroGLYCERIN (NITROSTAT) 0.4 MG SL tablet Place 1 tablet (0.4 mg total) under the tongue every 5 (five) minutes as needed for chest pain.  25 tablet  12  . potassium chloride (K-DUR) 10 MEQ tablet Take 20 mEq by mouth 2 (two) times daily. Take with Lasix (furosemide)      . pravastatin (PRAVACHOL) 40 MG tablet Take 1 tablet (40 mg total) by mouth every evening.  90 tablet  4  .  vitamin B-12 (CYANOCOBALAMIN) 1000 MCG tablet Take 1,000 mcg by mouth daily.        . vitamin E 100 UNIT capsule Take 100 Units by mouth daily.           Past Medical History  Diagnosis Date  . Essential hypertension   . CAD (coronary artery disease) 2005    NSTEMI 06/2011, cath showed chronically occluded RCA with good collaterals from the left system and no obstructive disease in the LAD the circumflex, nl LV fxn  . Aortic stenosis   . Spinal stenosis 2008  . Polio 1931    "made my left arm weak since" (05/01/2012)  . Hypothyroidism   . Pneumonia 1923; 1959; 1981; 2011  . B12 deficiency anemia   . Carotid artery stenosis 2005    right  . Complication of anesthesia     "I panic" (05/01/2012)  . High cholesterol   . Varicose veins     BLE "since the 1940's" (05/01/2012)  . Myocardial infarction ~ 2011  . Chronic bronchitis     "~ q winter"  (05/01/2012)  . GERD (gastroesophageal reflux disease)   . Arthritis     "all over my body" (05/01/2012)    Past Surgical History  Procedure Date  . Joint replacement   . Shoulder debridement   . Total knee arthroplasty 2000  . Thyroidectomy   . Bunionectomy 2004    "and big toe repair; right" (05/01/2012)  . Belpharoptosis repair 10/2007    bilaterally  . Tonsillectomy 1932  . Appendectomy 1940  . Ganglion cyst excision 1955    right hand  . Dilation and curettage of uterus 1969; 1974  . Cholecystectomy 1983  . Knee arthroscopy 1990    left  . Colonoscopy 1988; 1989; 1994; 1999    "all clear"  . Cataract extraction, bilateral [id number]  . Total hip arthroplasty 1999    right  . Cardiac catheterization 2006?    History   Social History  . Marital Status: Widowed    Spouse Name: N/A    Number of Children: N/A  . Years of Education: N/A   Occupational History  . Not on file.   Social History Main Topics  . Smoking status: Former Smoker -- 0.5 packs/day for 22 years    Types: Cigarettes    Quit date: 07/30/1962  . Smokeless tobacco: Never Used  . Alcohol Use: 8.4 oz/week    7 Glasses of wine, 7 Shots of liquor per week     Comment: 05/01/2012 "2 drinks/day; wine or liquor"  . Drug Use: No  . Sexually Active: No   Other Topics Concern  . Not on file   Social History Narrative  . No narrative on file    ROS: no fevers or chills, productive cough, hemoptysis, dysphasia, odynophagia, melena, hematochezia, dysuria, hematuria, rash, seizure activity, orthopnea, PND, pedal edema, claudication. Remaining systems are negative.  Physical Exam: Well-developed well-nourished in no acute distress.  Skin is warm and dry.  HEENT is normal.  Neck is supple.  Chest is clear to auscultation with normal expansion.  Cardiovascular exam is regular rate and rhythm. 3/6 systolic murmur left sternal border Abdominal exam nontender or distended. No masses palpated. Extremities  show trace edema. neuro grossly intact

## 2012-06-05 NOTE — Patient Instructions (Addendum)

## 2012-06-05 NOTE — Assessment & Plan Note (Signed)
Recent hospitalization for bronchitis and dyspnea. Troponin mildly elevated but appears to be chronically elevated. There may have also been a component of demand ischemia. Plan medical therapy as she is having no symptoms of chest pain.

## 2012-06-05 NOTE — Assessment & Plan Note (Signed)
Continue aspirin and statin. 

## 2012-06-05 NOTE — Assessment & Plan Note (Signed)
Repeat echocardiogram December 2013.

## 2012-06-05 NOTE — Assessment & Plan Note (Signed)
Continue present blood pressure medications. 

## 2012-06-05 NOTE — Assessment & Plan Note (Signed)
Continue statin. 

## 2012-07-08 ENCOUNTER — Ambulatory Visit (HOSPITAL_COMMUNITY): Payer: Medicare Other | Attending: Cardiology | Admitting: Radiology

## 2012-07-08 ENCOUNTER — Other Ambulatory Visit: Payer: Self-pay

## 2012-07-08 DIAGNOSIS — I059 Rheumatic mitral valve disease, unspecified: Secondary | ICD-10-CM | POA: Insufficient documentation

## 2012-07-08 DIAGNOSIS — I2510 Atherosclerotic heart disease of native coronary artery without angina pectoris: Secondary | ICD-10-CM | POA: Insufficient documentation

## 2012-07-08 DIAGNOSIS — I359 Nonrheumatic aortic valve disorder, unspecified: Secondary | ICD-10-CM | POA: Insufficient documentation

## 2012-07-08 DIAGNOSIS — I10 Essential (primary) hypertension: Secondary | ICD-10-CM | POA: Insufficient documentation

## 2012-07-08 DIAGNOSIS — I350 Nonrheumatic aortic (valve) stenosis: Secondary | ICD-10-CM

## 2012-07-08 DIAGNOSIS — R0609 Other forms of dyspnea: Secondary | ICD-10-CM | POA: Insufficient documentation

## 2012-07-08 DIAGNOSIS — E785 Hyperlipidemia, unspecified: Secondary | ICD-10-CM | POA: Insufficient documentation

## 2012-07-08 DIAGNOSIS — R0989 Other specified symptoms and signs involving the circulatory and respiratory systems: Secondary | ICD-10-CM | POA: Insufficient documentation

## 2012-07-08 DIAGNOSIS — I079 Rheumatic tricuspid valve disease, unspecified: Secondary | ICD-10-CM | POA: Insufficient documentation

## 2012-07-08 DIAGNOSIS — I517 Cardiomegaly: Secondary | ICD-10-CM | POA: Insufficient documentation

## 2012-07-08 NOTE — Progress Notes (Signed)
Echocardiogram performed.  

## 2012-09-22 ENCOUNTER — Other Ambulatory Visit: Payer: Self-pay | Admitting: Cardiology

## 2012-12-09 ENCOUNTER — Encounter: Payer: Self-pay | Admitting: Cardiology

## 2012-12-09 ENCOUNTER — Ambulatory Visit (INDEPENDENT_AMBULATORY_CARE_PROVIDER_SITE_OTHER): Payer: Medicare Other | Admitting: Cardiology

## 2012-12-09 VITALS — BP 128/68 | HR 64 | Ht 60.0 in | Wt 179.8 lb

## 2012-12-09 DIAGNOSIS — I359 Nonrheumatic aortic valve disorder, unspecified: Secondary | ICD-10-CM

## 2012-12-09 DIAGNOSIS — E785 Hyperlipidemia, unspecified: Secondary | ICD-10-CM

## 2012-12-09 DIAGNOSIS — I10 Essential (primary) hypertension: Secondary | ICD-10-CM

## 2012-12-09 DIAGNOSIS — I350 Nonrheumatic aortic (valve) stenosis: Secondary | ICD-10-CM

## 2012-12-09 MED ORDER — ASPIRIN EC 81 MG PO TBEC: 81.0000 mg | DELAYED_RELEASE_TABLET | Freq: Every day | ORAL | Status: AC

## 2012-12-09 NOTE — Assessment & Plan Note (Signed)
No symptoms at present.plan followup echoes in the future. Conservative measures if possible.

## 2012-12-09 NOTE — Patient Instructions (Addendum)
Your physician wants you to follow-up in: 6 MONTHS WITH DR CRENSHAW You will receive a reminder letter in the mail two months in advance. If you don't receive a letter, please call our office to schedule the follow-up appointment.   START ASPIRIN 81 MG ONCE DAILY WITH FOOD 

## 2012-12-09 NOTE — Assessment & Plan Note (Signed)
Continue statin. Add enteric-coated aspirin 81 mg daily. 

## 2012-12-09 NOTE — Assessment & Plan Note (Signed)
Blood pressure controlled. Continue present medications. 

## 2012-12-09 NOTE — Assessment & Plan Note (Signed)
Continue statin. Lipids and liver monitored by primary care. 

## 2012-12-09 NOTE — Progress Notes (Signed)
HPI: Pleasant female for fu of CAD and AS. Cardiac catheterization in December of 2012 revealed minor irregularities in the left main. There is a 25% LAD. There were proximal luminal irregularities in the circumflex. The right coronary artery was noted to be occluded. There was collateralization from the LAD and circumflex. The ejection fraction was 65%. Medical therapy recommended. VQ scan was very low probability. Echocardiogram in December of 2013 showed an ejection fraction of 55-60%, mild left ventricular hypertrophy, grade 1 diastolic dysfunction, moderate aortic stenosis with a mean gradient of 32 mm mercury and moderate left atrial enlargement. Patient also appears to have a history of chronically elevated troponin. I last saw her in November of 2013. Since then, the patient denies any dyspnea on exertion, orthopnea, PND, palpitations, syncope or chest pain. Occasional mild pedal edema   Current Outpatient Prescriptions  Medication Sig Dispense Refill  . acetaminophen (TYLENOL ARTHRITIS PAIN) 650 MG CR tablet Take 1,300 mg by mouth 2 (two) times daily. For pain      . B Complex-C (B-COMPLEX WITH VITAMIN C) tablet Take 1 tablet by mouth daily.        . benazepril (LOTENSIN) 10 MG tablet Take 10 mg by mouth daily.       . cholecalciferol (VITAMIN D) 1000 UNITS tablet Take 1,000 Units by mouth daily.        . clotrimazole-betamethasone (LOTRISONE) cream Apply 1 application topically 2 (two) times daily.      . famotidine (PEPCID) 20 MG tablet Take 20 mg by mouth 2 (two) times daily.      . fexofenadine (ALLEGRA) 60 MG tablet Take 60 mg by mouth 2 (two) times daily.      . furosemide (LASIX) 20 MG tablet Take 20 mg by mouth 2 (two) times daily.        Marland Kitchen gelatin 650 MG capsule Take 650 mg by mouth daily.        . Glucosamine-Chondroitin (OSTEO BI-FLEX REGULAR STRENGTH PO) Take 1 capsule by mouth daily.      . Lecithin 400 MG CAPS Take 1 capsule by mouth 2 (two) times daily.      Marland Kitchen  levothyroxine (SYNTHROID, LEVOTHROID) 88 MCG tablet Take 88 mcg by mouth daily.        Marland Kitchen lidocaine (LIDODERM) 5 % Place 1 patch onto the skin daily. Remove & Discard patch within 12 hours or as directed by MD      . Melatonin 3 MG TABS Take 3 mg by mouth at bedtime.       . metoprolol (TOPROL-XL) 50 MG 24 hr tablet Take 50 mg by mouth every evening.       . Multiple Vitamin (MULTIVITAMIN WITH MINERALS) TABS Take 1 tablet by mouth daily. Centrum chewables      . nitroGLYCERIN (NITROSTAT) 0.4 MG SL tablet Place 1 tablet (0.4 mg total) under the tongue every 5 (five) minutes as needed for chest pain.  25 tablet  12  . potassium chloride (K-DUR) 10 MEQ tablet Take 20 mEq by mouth 2 (two) times daily. Take with Lasix (furosemide)      . pravastatin (PRAVACHOL) 40 MG tablet TAKE 1 TABLET BY MOUTH EVERY EVENING  90 tablet  1  . vitamin B-12 (CYANOCOBALAMIN) 1000 MCG tablet Take 1,000 mcg by mouth daily.        . vitamin E 100 UNIT capsule Take 100 Units by mouth daily.         No current facility-administered medications for this  visit.     Past Medical History  Diagnosis Date  . Essential hypertension   . CAD (coronary artery disease) 2005    NSTEMI 06/2011, cath showed chronically occluded RCA with good collaterals from the left system and no obstructive disease in the LAD the circumflex, nl LV fxn  . Aortic stenosis   . Spinal stenosis 2008  . Polio 1931    "made my left arm weak since" (05/01/2012)  . Hypothyroidism   . Pneumonia 1923; 1959; 1981; 2011  . B12 deficiency anemia   . Carotid artery stenosis 2005    right  . Complication of anesthesia     "I panic" (05/01/2012)  . High cholesterol   . Varicose veins     BLE "since the 1940's" (05/01/2012)  . Myocardial infarction ~ 2011  . Chronic bronchitis     "~ q winter" (05/01/2012)  . GERD (gastroesophageal reflux disease)   . Arthritis     "all over my body" (05/01/2012)    Past Surgical History  Procedure Laterality Date  .  Joint replacement    . Shoulder debridement    . Total knee arthroplasty  2000  . Thyroidectomy    . Bunionectomy  2004    "and big toe repair; right" (05/01/2012)  . Belpharoptosis repair  10/2007    bilaterally  . Tonsillectomy  1932  . Appendectomy  1940  . Ganglion cyst excision  1955    right hand  . Dilation and curettage of uterus  1969; 1974  . Cholecystectomy  1983  . Knee arthroscopy  1990    left  . Colonoscopy  1988; 1989; 1994; 1999    "all clear"  . Cataract extraction, bilateral  [id number]  . Total hip arthroplasty  1999    right  . Cardiac catheterization  2006?    History   Social History  . Marital Status: Widowed    Spouse Name: N/A    Number of Children: N/A  . Years of Education: N/A   Occupational History  . Not on file.   Social History Main Topics  . Smoking status: Former Smoker -- 0.50 packs/day for 22 years    Types: Cigarettes    Quit date: 07/30/1962  . Smokeless tobacco: Never Used  . Alcohol Use: 8.4 oz/week    7 Glasses of wine, 7 Shots of liquor per week     Comment: 05/01/2012 "2 drinks/day; wine or liquor"  . Drug Use: No  . Sexually Active: No   Other Topics Concern  . Not on file   Social History Narrative  . No narrative on file    ROS: no fevers or chills, productive cough, hemoptysis, dysphasia, odynophagia, melena, hematochezia, dysuria, hematuria, rash, seizure activity, orthopnea, PND, pedal edema, claudication. Remaining systems are negative.  Physical Exam: Well-developed well-nourished in no acute distress.  Skin is warm and dry.  HEENT is normal.  Neck is supple.  Chest is clear to auscultation with normal expansion.  Cardiovascular exam is regular rate and rhythm. 2/6 systolic murmur left sternal border. S2 not diminished Abdominal exam nontender or distended. No masses palpated. Extremities show trace edema. neuro grossly intact  ECG sinus rhythm at a rate of 64. Cannot rule out prior to. No ST  changes.

## 2013-02-01 ENCOUNTER — Other Ambulatory Visit: Payer: Self-pay | Admitting: Cardiology

## 2013-04-22 ENCOUNTER — Other Ambulatory Visit: Payer: Self-pay | Admitting: Cardiology

## 2013-04-24 ENCOUNTER — Emergency Department (HOSPITAL_COMMUNITY)
Admission: EM | Admit: 2013-04-24 | Discharge: 2013-04-24 | Disposition: A | Discharge status: Home / Self Care | Payer: Medicare Other | Attending: Emergency Medicine | Admitting: Emergency Medicine

## 2013-04-24 ENCOUNTER — Encounter (HOSPITAL_COMMUNITY): Payer: Self-pay | Admitting: *Deleted

## 2013-04-24 ENCOUNTER — Emergency Department (HOSPITAL_COMMUNITY): Payer: Medicare Other

## 2013-04-24 DIAGNOSIS — R0682 Tachypnea, not elsewhere classified: Secondary | ICD-10-CM | POA: Insufficient documentation

## 2013-04-24 DIAGNOSIS — Z87891 Personal history of nicotine dependence: Secondary | ICD-10-CM | POA: Insufficient documentation

## 2013-04-24 DIAGNOSIS — Z7982 Long term (current) use of aspirin: Secondary | ICD-10-CM | POA: Insufficient documentation

## 2013-04-24 DIAGNOSIS — Z8612 Personal history of poliomyelitis: Secondary | ICD-10-CM | POA: Insufficient documentation

## 2013-04-24 DIAGNOSIS — R0989 Other specified symptoms and signs involving the circulatory and respiratory systems: Secondary | ICD-10-CM | POA: Insufficient documentation

## 2013-04-24 DIAGNOSIS — Z8739 Personal history of other diseases of the musculoskeletal system and connective tissue: Secondary | ICD-10-CM | POA: Insufficient documentation

## 2013-04-24 DIAGNOSIS — Z79899 Other long term (current) drug therapy: Secondary | ICD-10-CM | POA: Insufficient documentation

## 2013-04-24 DIAGNOSIS — E7800 Pure hypercholesterolemia, unspecified: Secondary | ICD-10-CM | POA: Insufficient documentation

## 2013-04-24 DIAGNOSIS — Z8701 Personal history of pneumonia (recurrent): Secondary | ICD-10-CM | POA: Insufficient documentation

## 2013-04-24 DIAGNOSIS — Z8709 Personal history of other diseases of the respiratory system: Secondary | ICD-10-CM | POA: Insufficient documentation

## 2013-04-24 DIAGNOSIS — R0609 Other forms of dyspnea: Secondary | ICD-10-CM | POA: Insufficient documentation

## 2013-04-24 DIAGNOSIS — R0600 Dyspnea, unspecified: Secondary | ICD-10-CM

## 2013-04-24 DIAGNOSIS — E039 Hypothyroidism, unspecified: Secondary | ICD-10-CM | POA: Insufficient documentation

## 2013-04-24 DIAGNOSIS — I252 Old myocardial infarction: Secondary | ICD-10-CM | POA: Insufficient documentation

## 2013-04-24 DIAGNOSIS — Z862 Personal history of diseases of the blood and blood-forming organs and certain disorders involving the immune mechanism: Secondary | ICD-10-CM | POA: Insufficient documentation

## 2013-04-24 DIAGNOSIS — R062 Wheezing: Secondary | ICD-10-CM | POA: Insufficient documentation

## 2013-04-24 DIAGNOSIS — I2510 Atherosclerotic heart disease of native coronary artery without angina pectoris: Secondary | ICD-10-CM | POA: Insufficient documentation

## 2013-04-24 DIAGNOSIS — R0789 Other chest pain: Secondary | ICD-10-CM | POA: Insufficient documentation

## 2013-04-24 DIAGNOSIS — Z8679 Personal history of other diseases of the circulatory system: Secondary | ICD-10-CM | POA: Insufficient documentation

## 2013-04-24 DIAGNOSIS — Z8719 Personal history of other diseases of the digestive system: Secondary | ICD-10-CM | POA: Insufficient documentation

## 2013-04-24 DIAGNOSIS — R011 Cardiac murmur, unspecified: Secondary | ICD-10-CM | POA: Insufficient documentation

## 2013-04-24 DIAGNOSIS — I10 Essential (primary) hypertension: Secondary | ICD-10-CM | POA: Insufficient documentation

## 2013-04-24 LAB — CBC
HCT: 36.5 % (ref 36.0–46.0)
Hemoglobin: 12.1 g/dL (ref 12.0–15.0)
MCH: 33.5 pg (ref 26.0–34.0)
Platelets: 181 10*3/uL (ref 150–400)
RDW: 12.8 % (ref 11.5–15.5)
WBC: 7.2 10*3/uL (ref 4.0–10.5)

## 2013-04-24 LAB — BASIC METABOLIC PANEL
BUN: 31 mg/dL — ABNORMAL HIGH (ref 6–23)
Calcium: 9.7 mg/dL (ref 8.4–10.5)
Chloride: 98 mEq/L (ref 96–112)
GFR calc Af Amer: 45 mL/min — ABNORMAL LOW (ref >90)
Potassium: 4.6 mEq/L (ref 3.5–5.1)
Sodium: 135 mEq/L (ref 135–145)

## 2013-04-24 LAB — POCT I-STAT TROPONIN I: Troponin i, poc: 0.03 ng/mL (ref 0.00–0.08)

## 2013-04-24 LAB — PRO B NATRIURETIC PEPTIDE: Pro B Natriuretic peptide (BNP): 757.5 pg/mL — ABNORMAL HIGH (ref 0–450)

## 2013-04-24 MED ORDER — FUROSEMIDE 20 MG PO TABS: 30.0000 mg | ORAL_TABLET | Freq: Two times a day (BID) | ORAL | Status: DC

## 2013-04-24 MED ORDER — NITROGLYCERIN 2 % TD OINT
0.5000 [in_us] | TOPICAL_OINTMENT | Freq: Once | TRANSDERMAL | Status: AC
  Administered 2013-04-24: 0.5 [in_us] via TOPICAL
  Filled 2013-04-24: qty 30

## 2013-04-24 MED ORDER — FUROSEMIDE 10 MG/ML IJ SOLN
40.0000 mg | Freq: Once | INTRAMUSCULAR | Status: AC
  Administered 2013-04-24: 40 mg via INTRAVENOUS
  Filled 2013-04-24: qty 4

## 2013-04-24 NOTE — ED Notes (Signed)
MD at bedside. Dr. Lockwood at bedside.  

## 2013-04-24 NOTE — ED Notes (Addendum)
Per ems pt from assisted living center, pt reported yesterday she started feeling like she was getting pneumonia again. Pt called pcp today, pcp told her to come to ED for evaluation. Denies pain, n/v, or fever. Lung sounds clear.  Pt reports this is the same feeling she had before when she got pneumonia. Pt also has some anxiety from DNR, pt stated "I don't want to die".  DNR paper work at facility.   Pt wants granddaugther called [facility] [phone],   Cell [phone]  Upon rn assessment, pt lung sounds clear. Pt reports "heaviness in her chest". Denies pain. 95% on room air.

## 2013-04-24 NOTE — ED Notes (Signed)
Pt ambulatory to BR with minimal assistance. O2 sats 94% with ambulation. Pt with no acute distress.

## 2013-04-24 NOTE — ED Notes (Signed)
Pt anxious and states she can't breathe. Pt placed on 2L O2. Dr. Jeraldine Loots aware.

## 2013-04-24 NOTE — ED Provider Notes (Signed)
CSN: [id number]     Arrival date & time 04/24/13  1422 History   First MD Initiated Contact with Patient 04/24/13 1555     Chief Complaint  Patient presents with  . chest heaviness    (Consider location/radiation/quality/duration/timing/severity/associated sxs/prior Treatment) HPI Patient presents with dyspnea. This episode began earlier today without clear precipitant.  Since onset symptoms have been progressive. There is mild associated chest, but no significant chest pain. No syncope, no lightheadedness. No nausea, no vomiting, no diarrhea, no falls. No cough, no fever. Patient states that she is compliant with all medication.  Past Medical History  Diagnosis Date  . Essential hypertension   . CAD (coronary artery disease) 2005    NSTEMI 06/2011, cath showed chronically occluded RCA with good collaterals from the left system and no obstructive disease in the LAD the circumflex, nl LV fxn  . Aortic stenosis   . Spinal stenosis 2008  . Polio 1931    "made my left arm weak since" (05/01/2012)  . Hypothyroidism   . Pneumonia 1923; 1959; 1981; 2011  . B12 deficiency anemia   . Carotid artery stenosis 2005    right  . Complication of anesthesia     "I panic" (05/01/2012)  . High cholesterol   . Varicose veins     BLE "since the 1940's" (05/01/2012)  . Myocardial infarction ~ 2011  . Chronic bronchitis     "~ q winter" (05/01/2012)  . GERD (gastroesophageal reflux disease)   . Arthritis     "all over my body" (05/01/2012)   Past Surgical History  Procedure Laterality Date  . Joint replacement    . Shoulder debridement    . Total knee arthroplasty  2000  . Thyroidectomy    . Bunionectomy  2004    "and big toe repair; right" (05/01/2012)  . Belpharoptosis repair  10/2007    bilaterally  . Tonsillectomy  1932  . Appendectomy  1940  . Ganglion cyst excision  1955    right hand  . Dilation and curettage of uterus  1969; 1974  . Cholecystectomy  1983  . Knee arthroscopy   1990    left  . Colonoscopy  1988; 1989; 1994; 1999    "all clear"  . Cataract extraction, bilateral  [id number]  . Total hip arthroplasty  1999    right  . Cardiac catheterization  2006?   Family History  Problem Relation Age of Onset  . Coronary artery disease Father   . Coronary artery disease Brother   . Coronary artery disease Brother    History  Substance Use Topics  . Smoking status: Former Smoker -- 0.50 packs/day for 22 years    Types: Cigarettes    Quit date: 07/30/1962  . Smokeless tobacco: Never Used  . Alcohol Use: 8.4 oz/week    7 Glasses of wine, 7 Shots of liquor per week     Comment: 05/01/2012 "2 drinks/day; wine or liquor"   OB History   Grav Para Term Preterm Abortions TAB SAB Ect Mult Living                 Review of Systems  Constitutional:       Per HPI, otherwise negative  HENT:       Per HPI, otherwise negative  Respiratory:       Per HPI, otherwise negative  Cardiovascular:       Per HPI, otherwise negative  Gastrointestinal: Negative for vomiting.  Endocrine:  Negative aside from HPI  Genitourinary:       Neg aside from HPI   Musculoskeletal:       Per HPI, otherwise negative  Skin: Negative.   Neurological: Negative for syncope.    Allergies  Codeine  Home Medications   Current Outpatient Rx  Name  Route  Sig  Dispense  Refill  . aspirin EC 81 MG tablet   Oral   Take 1 tablet (81 mg total) by mouth daily.   90 tablet   3   . B Complex-C (B-COMPLEX WITH VITAMIN C) tablet   Oral   Take 1 tablet by mouth daily.           . benazepril (LOTENSIN) 10 MG tablet   Oral   Take 10 mg by mouth daily.          . cholecalciferol (VITAMIN D) 1000 UNITS tablet   Oral   Take 1,000 Units by mouth daily.           . clotrimazole-betamethasone (LOTRISONE) cream   Topical   Apply 1 application topically 2 (two) times daily.         . famotidine (PEPCID) 20 MG tablet   Oral   Take 20 mg by mouth 2 (two) times daily.          . fexofenadine (ALLEGRA) 60 MG tablet   Oral   Take 60 mg by mouth 2 (two) times daily.         Marland Kitchen gelatin 650 MG capsule   Oral   Take 650 mg by mouth daily.           . Glucosamine-Chondroitin (OSTEO BI-FLEX REGULAR STRENGTH PO)   Oral   Take 1 capsule by mouth daily.         . Lecithin 400 MG CAPS   Oral   Take 1 capsule by mouth 2 (two) times daily.         Marland Kitchen levothyroxine (SYNTHROID, LEVOTHROID) 88 MCG tablet   Oral   Take 88 mcg by mouth daily.           Marland Kitchen lidocaine (LIDODERM) 5 %   Transdermal   Place 1 patch onto the skin daily. Remove & Discard patch within 12 hours or as directed by MD         . Melatonin 3 MG TABS   Oral   Take 3 mg by mouth at bedtime.          . metoprolol (TOPROL-XL) 50 MG 24 hr tablet   Oral   Take 50 mg by mouth every evening.          . Multiple Vitamin (MULTIVITAMIN WITH MINERALS) TABS   Oral   Take 1 tablet by mouth daily. Centrum chewables         . potassium chloride (K-DUR) 10 MEQ tablet   Oral   Take 20 mEq by mouth 2 (two) times daily. Take with Lasix (furosemide)         . pravastatin (PRAVACHOL) 40 MG tablet      TAKE 1 TABLET EVERY EVENING   90 tablet   0   . vitamin B-12 (CYANOCOBALAMIN) 1000 MCG tablet   Oral   Take 1,000 mcg by mouth daily.           . vitamin E 100 UNIT capsule   Oral   Take 100 Units by mouth daily.           Marland Kitchen acetaminophen (TYLENOL  ARTHRITIS PAIN) 650 MG CR tablet   Oral   Take 1,300 mg by mouth 2 (two) times daily. For pain         . furosemide (LASIX) 20 MG tablet   Oral   Take 1.5 tablets (30 mg total) by mouth 2 (two) times daily.   30 tablet   0    BP 137/66  Pulse 84  Temp(Src) 98.4 F (36.9 C) (Oral)  Resp 18  SpO2 95% Physical Exam  Nursing note and vitals reviewed. Constitutional: She is oriented to person, place, and time. She appears well-developed and well-nourished. No distress.  HENT:  Head: Normocephalic and atraumatic.  Eyes:  Conjunctivae and EOM are normal.  Cardiovascular: Normal rate and regular rhythm.   Murmur heard. Pulmonary/Chest: Accessory muscle usage present. No stridor. Tachypnea noted. She is in respiratory distress. She has decreased breath sounds. She has wheezes.  Abdominal: She exhibits no distension.  Musculoskeletal: She exhibits no edema.  Neurological: She is alert and oriented to person, place, and time. No cranial nerve deficit.  Skin: Skin is warm and dry.  Psychiatric: She has a normal mood and affect.    ED Course  Procedures (including critical care time) Labs Review Labs Reviewed  CBC - Abnormal; Notable for the following:    RBC 3.61 (*)    MCV 101.1 (*)    All other components within normal limits  BASIC METABOLIC PANEL - Abnormal; Notable for the following:    Glucose, Bld 110 (*)    BUN 31 (*)    Creatinine, Ser 1.18 (*)    GFR calc non Af Amer 39 (*)    GFR calc Af Amer 45 (*)    All other components within normal limits  PRO B NATRIURETIC PEPTIDE - Abnormal; Notable for the following:    Pro B Natriuretic peptide (BNP) 757.5 (*)    All other components within normal limits  POCT I-STAT TROPONIN I   Imaging Review Dg Chest Port 1 View  04/24/2013   CLINICAL DATA:  Chest heaviness. Prior history of MI x 2. Current history of hypertension. Former smoker.  EXAM: PORTABLE CHEST - 1 VIEW  COMPARISON:  Two-view chest x-ray 05/15/2012, 05/02/2012. Portable chest x-ray 05/01/2012.  FINDINGS: Cardiac silhouette moderately enlarged but stable. Thoracic aorta tortuous and atherosclerotic, unchanged. Extensive mitral annular calcification as noted previously. Hilar and mediastinal contours otherwise unremarkable. Lungs clear. Bronchovascular markings normal. Pulmonary vascularity normal. No visible pleural effusions. No pneumothorax. Severe degenerative changes in both shoulders as noted previously.  IMPRESSION: Stable moderate cardiomegaly. No acute cardiopulmonary disease.    Electronically Signed   By: Hulan Saas   On: 04/24/2013 15:47   cardiac monitor 75 sinus rhythm normal Pulse oximetry 90% room air abnormal EKG has a sinus rhythm, rate 74, premature atrial contractions, nonspecific T wave changes and abnormal   After the initial evaluation the patient confirms DO NOT RESUSCITATE status  7:09 PM Patient states that she feels substantially better, has no complaints. Breathing is nonlabored, and she is not hypoxic.   Update: Labs reviewed with the patient and her grand daughter. Patient is ambulated with no hypoxia, distress, return of dyspnea or chest tightness. Patient requests discharge.   MDM   1. Dyspnea    This elderly female with aortic stenosis, CAD, now presents with dyspnea, and associated chest tightness.  Given her multiple medical problems, there suspicion for progression of disease versus ACS.  No suspicion for infection. The patient's labs are notable for  elevated BNP, unremarkable troponin. Given the patient's substantial improvement with Lasix, nitroglycerin, and her non-resuscitation status, with her return to baseline functionality, she was discharged, per her request, to follow up with her primary care physician.    Gerhard Munch, MD 04/24/13 1911

## 2013-04-24 NOTE — ED Notes (Signed)
rn called granddaughter and left message stating pt was at [facility] at present

## 2013-06-09 ENCOUNTER — Encounter: Payer: Self-pay | Admitting: Cardiology

## 2013-06-11 ENCOUNTER — Ambulatory Visit (INDEPENDENT_AMBULATORY_CARE_PROVIDER_SITE_OTHER): Payer: Medicare Other | Admitting: Cardiology

## 2013-06-11 ENCOUNTER — Encounter: Payer: Self-pay | Admitting: Cardiology

## 2013-06-11 VITALS — BP 128/70 | HR 60 | Ht 60.0 in | Wt 166.0 lb

## 2013-06-11 DIAGNOSIS — I350 Nonrheumatic aortic (valve) stenosis: Secondary | ICD-10-CM

## 2013-06-11 DIAGNOSIS — E785 Hyperlipidemia, unspecified: Secondary | ICD-10-CM

## 2013-06-11 DIAGNOSIS — I359 Nonrheumatic aortic valve disorder, unspecified: Secondary | ICD-10-CM

## 2013-06-11 DIAGNOSIS — I2510 Atherosclerotic heart disease of native coronary artery without angina pectoris: Secondary | ICD-10-CM

## 2013-06-11 DIAGNOSIS — I10 Essential (primary) hypertension: Secondary | ICD-10-CM

## 2013-06-11 NOTE — Progress Notes (Signed)
HPI: FU CAD and AS. Cardiac catheterization in December of 2012 revealed minor irregularities in the left main. There is a 25% LAD. There were proximal luminal irregularities in the circumflex. The right coronary artery was noted to be occluded. There was collateralization from the LAD and circumflex. The ejection fraction was 65%. Medical therapy recommended. VQ scan was very low probability. Echocardiogram in December of 2013 showed an ejection fraction of 55-60%, mild left ventricular hypertrophy, grade 1 diastolic dysfunction, moderate aortic stenosis with a mean gradient of 32 mm mercury and moderate left atrial enlargement. Patient also appears to have a history of chronically elevated troponin. I last saw her in May of 2014. Since then, she has some dyspnea on exertion but no orthopnea, PND, chest pain or syncope. Occasional mild pedal edema. She did have an episode of dyspnea in September and was seen in the emergency room with a negative troponin.   Current Outpatient Prescriptions  Medication Sig Dispense Refill  . acetaminophen (TYLENOL ARTHRITIS PAIN) 650 MG CR tablet Take 1,300 mg by mouth 2 (two) times daily. For pain      . albuterol (PROVENTIL HFA;VENTOLIN HFA) 108 (90 BASE) MCG/ACT inhaler Inhale into the lungs every 6 (six) hours as needed for wheezing or shortness of breath.      Marland Kitchen aspirin EC 81 MG tablet Take 1 tablet (81 mg total) by mouth daily.  90 tablet  3  . B Complex-C (B-COMPLEX WITH VITAMIN C) tablet Take 1 tablet by mouth daily.        . benazepril (LOTENSIN) 10 MG tablet Take 10 mg by mouth daily.       . cholecalciferol (VITAMIN D) 1000 UNITS tablet Take 1,000 Units by mouth daily.        . clobetasol cream (TEMOVATE) 0.05 % Apply 1 application topically 2 (two) times daily.      . clotrimazole-betamethasone (LOTRISONE) cream Apply 1 application topically 2 (two) times daily.      . famotidine (PEPCID) 20 MG tablet Take 20 mg by mouth 2 (two) times daily.        . fexofenadine (ALLEGRA) 60 MG tablet Take 60 mg by mouth 2 (two) times daily.      Marland Kitchen gelatin 650 MG capsule Take 650 mg by mouth daily.        . Glucosamine-Chondroitin (OSTEO BI-FLEX REGULAR STRENGTH PO) Take 1 capsule by mouth daily.      . Lecithin 400 MG CAPS Take 1 capsule by mouth 2 (two) times daily.      Marland Kitchen levothyroxine (SYNTHROID, LEVOTHROID) 88 MCG tablet Take 88 mcg by mouth daily.        Marland Kitchen lidocaine (LIDODERM) 5 % Place 1 patch onto the skin daily. Remove & Discard patch within 12 hours or as directed by MD      . Melatonin 3 MG TABS Take 3 mg by mouth at bedtime.       . metoprolol (TOPROL-XL) 50 MG 24 hr tablet Take 50 mg by mouth every evening.       . Multiple Vitamin (MULTIVITAMIN WITH MINERALS) TABS Take 1 tablet by mouth daily. Centrum chewables      . nitroGLYCERIN (NITROSTAT) 0.4 MG SL tablet Place 0.4 mg under the tongue every 5 (five) minutes as needed for chest pain.      . polyethylene glycol (MIRALAX / GLYCOLAX) packet Take 17 g by mouth daily.      . potassium chloride (K-DUR) 10 MEQ tablet  Take 20 mEq by mouth 2 (two) times daily. Take with Lasix (furosemide)      . potassium chloride (K-DUR,KLOR-CON) 10 MEQ tablet Take 10 mEq by mouth 2 (two) times daily.      . pravastatin (PRAVACHOL) 40 MG tablet TAKE 1 TABLET EVERY EVENING  90 tablet  0  . psyllium (METAMUCIL) 58.6 % powder Take 1 packet by mouth 3 (three) times daily.      . vitamin B-12 (CYANOCOBALAMIN) 1000 MCG tablet Take 1,000 mcg by mouth daily.        . vitamin E 100 UNIT capsule Take 100 Units by mouth daily.        . furosemide (LASIX) 20 MG tablet Take 1.5 tablets (30 mg total) by mouth 2 (two) times daily.  30 tablet  0   No current facility-administered medications for this visit.     Past Medical History  Diagnosis Date  . Essential hypertension   . CAD (coronary artery disease) 2005    NSTEMI 06/2011, cath showed chronically occluded RCA with good collaterals from the left system and no  obstructive disease in the LAD the circumflex, nl LV fxn  . Aortic stenosis   . Spinal stenosis 2008  . Polio 1931    "made my left arm weak since" (05/01/2012)  . Hypothyroidism   . Pneumonia 1923; 1959; 1981; 2011  . B12 deficiency anemia   . Carotid artery stenosis 2005    right  . Complication of anesthesia     "I panic" (05/01/2012)  . High cholesterol   . Varicose veins     BLE "since the 1940's" (05/01/2012)  . Myocardial infarction ~ 2011  . Chronic bronchitis     "~ q winter" (05/01/2012)  . GERD (gastroesophageal reflux disease)   . Arthritis     "all over my body" (05/01/2012)    Past Surgical History  Procedure Laterality Date  . Joint replacement    . Shoulder debridement    . Total knee arthroplasty  2000  . Thyroidectomy    . Bunionectomy  2004    "and big toe repair; right" (05/01/2012)  . Belpharoptosis repair  10/2007    bilaterally  . Tonsillectomy  1932  . Appendectomy  1940  . Ganglion cyst excision  1955    right hand  . Dilation and curettage of uterus  1969; 1974  . Cholecystectomy  1983  . Knee arthroscopy  1990    left  . Colonoscopy  1988; 1989; 1994; 1999    "all clear"  . Cataract extraction, bilateral  [id number]  . Total hip arthroplasty  1999    right  . Cardiac catheterization  2006?    History   Social History  . Marital Status: Widowed    Spouse Name: N/A    Number of Children: N/A  . Years of Education: N/A   Occupational History  . Not on file.   Social History Main Topics  . Smoking status: Former Smoker -- 0.50 packs/day for 22 years    Types: Cigarettes    Quit date: 07/30/1962  . Smokeless tobacco: Never Used  . Alcohol Use: 8.4 oz/week    7 Glasses of wine, 7 Shots of liquor per week     Comment: 05/01/2012 "2 drinks/day; wine or liquor"  . Drug Use: No  . Sexual Activity: No   Other Topics Concern  . Not on file   Social History Narrative  . No narrative on file    ROS:  no fevers or chills, productive  cough, hemoptysis, dysphasia, odynophagia, melena, hematochezia, dysuria, hematuria, rash, seizure activity, orthopnea, PND, pedal edema, claudication. Remaining systems are negative.  Physical Exam: Well-developed well-nourished in no acute distress.  Skin is warm and dry.  HEENT is normal.  Neck is supple.  Chest is clear to auscultation with normal expansion.  Cardiovascular exam is regular rate and rhythm. 3/6 systolic murmur left sternal border. Abdominal exam nontender or distended. No masses palpated. Extremities show trace edema. neuro grossly intact  04/24/2012-sinus rhythm, PAC, cannot rule out prior anterior infarct.

## 2013-06-11 NOTE — Patient Instructions (Signed)
Your physician wants you to follow-up in: 6 MONTHS WITH DR CRENSHAW You will receive a reminder letter in the mail two months in advance. If you don't receive a letter, please call our office to schedule the follow-up appointment.  

## 2013-06-11 NOTE — Assessment & Plan Note (Signed)
Continue present blood pressure medications. 

## 2013-06-11 NOTE — Assessment & Plan Note (Signed)
Continue statin. 

## 2013-06-11 NOTE — Assessment & Plan Note (Addendum)
Given age and conservative measures if possible. Repeat echocardiogram when she returns in 6 months. Patient presently on diuretics. I will have her most recent potassium and renal function sent to [place] from her primary care physician.

## 2013-06-11 NOTE — Assessment & Plan Note (Signed)
Continue aspirin and statin. 

## 2013-07-03 ENCOUNTER — Other Ambulatory Visit: Payer: Self-pay | Admitting: Cardiology

## 2013-07-28 ENCOUNTER — Inpatient Hospital Stay (HOSPITAL_COMMUNITY)
Admission: EM | Admit: 2013-07-28 | Discharge: 2013-07-30 | DRG: 293 | Disposition: A | Discharge status: Home Health | Payer: Medicare Other | Attending: Cardiology | Admitting: Cardiology

## 2013-07-28 ENCOUNTER — Emergency Department (HOSPITAL_COMMUNITY): Payer: Medicare Other

## 2013-07-28 DIAGNOSIS — Z96659 Presence of unspecified artificial knee joint: Secondary | ICD-10-CM

## 2013-07-28 DIAGNOSIS — E785 Hyperlipidemia, unspecified: Secondary | ICD-10-CM | POA: Diagnosis present

## 2013-07-28 DIAGNOSIS — I6529 Occlusion and stenosis of unspecified carotid artery: Secondary | ICD-10-CM | POA: Diagnosis present

## 2013-07-28 DIAGNOSIS — K219 Gastro-esophageal reflux disease without esophagitis: Secondary | ICD-10-CM | POA: Diagnosis present

## 2013-07-28 DIAGNOSIS — I5033 Acute on chronic diastolic (congestive) heart failure: Principal | ICD-10-CM | POA: Diagnosis present

## 2013-07-28 DIAGNOSIS — R7989 Other specified abnormal findings of blood chemistry: Secondary | ICD-10-CM | POA: Diagnosis present

## 2013-07-28 DIAGNOSIS — I252 Old myocardial infarction: Secondary | ICD-10-CM

## 2013-07-28 DIAGNOSIS — Z79899 Other long term (current) drug therapy: Secondary | ICD-10-CM

## 2013-07-28 DIAGNOSIS — I10 Essential (primary) hypertension: Secondary | ICD-10-CM | POA: Diagnosis present

## 2013-07-28 DIAGNOSIS — I509 Heart failure, unspecified: Secondary | ICD-10-CM

## 2013-07-28 DIAGNOSIS — I359 Nonrheumatic aortic valve disorder, unspecified: Secondary | ICD-10-CM | POA: Diagnosis present

## 2013-07-28 DIAGNOSIS — Z96649 Presence of unspecified artificial hip joint: Secondary | ICD-10-CM

## 2013-07-28 DIAGNOSIS — I2510 Atherosclerotic heart disease of native coronary artery without angina pectoris: Secondary | ICD-10-CM | POA: Diagnosis present

## 2013-07-28 DIAGNOSIS — Z87891 Personal history of nicotine dependence: Secondary | ICD-10-CM

## 2013-07-28 DIAGNOSIS — I214 Non-ST elevation (NSTEMI) myocardial infarction: Secondary | ICD-10-CM

## 2013-07-28 DIAGNOSIS — I350 Nonrheumatic aortic (valve) stenosis: Secondary | ICD-10-CM

## 2013-07-28 DIAGNOSIS — Z7982 Long term (current) use of aspirin: Secondary | ICD-10-CM

## 2013-07-28 DIAGNOSIS — Z8612 Personal history of poliomyelitis: Secondary | ICD-10-CM

## 2013-07-28 DIAGNOSIS — E039 Hypothyroidism, unspecified: Secondary | ICD-10-CM | POA: Diagnosis present

## 2013-07-28 DIAGNOSIS — J42 Unspecified chronic bronchitis: Secondary | ICD-10-CM | POA: Diagnosis present

## 2013-07-28 HISTORY — DX: Nonrheumatic aortic (valve) stenosis: I35.0

## 2013-07-28 LAB — PRO B NATRIURETIC PEPTIDE: Pro B Natriuretic peptide (BNP): 5796 pg/mL — ABNORMAL HIGH (ref 0–450)

## 2013-07-28 LAB — POCT I-STAT TROPONIN I

## 2013-07-28 MED ORDER — ASPIRIN 81 MG PO CHEW
324.0000 mg | CHEWABLE_TABLET | Freq: Once | ORAL | Status: AC
  Administered 2013-07-28: 324 mg via ORAL
  Filled 2013-07-28: qty 4

## 2013-07-28 MED ORDER — FUROSEMIDE 10 MG/ML IJ SOLN
20.0000 mg | Freq: Once | INTRAMUSCULAR | Status: AC
  Administered 2013-07-28: 20 mg via INTRAVENOUS
  Filled 2013-07-28: qty 2

## 2013-07-28 NOTE — ED Notes (Signed)
Patient taken off bedpan.

## 2013-07-28 NOTE — ED Notes (Signed)
Pt to ED with c/o shortness of breath on exertion.  Pt's Lasix was recently decreased and since then pt has become more short of breath.  Pt normally walks 2 miles a day but noticed 3 days ago when she was walking she was short of breath.  Today while at PT her therapist noticed that she was short of breath and called 911.  Pt denies any chest pain or shortness of breath while sitting.

## 2013-07-28 NOTE — ED Provider Notes (Signed)
CSN: [id number]     Arrival date & time 07/28/13  1830 History   First MD Initiated Contact with Patient 07/28/13 1843     Chief Complaint  Patient presents with  . Shortness of Breath   (Consider location/radiation/quality/duration/timing/severity/associated sxs/prior Treatment) HPI Comments: Holly Gibson is a 79 y.o. Female who developed shortness of breath suddenly today while walking with her physical therapist. She denies chest pain, weakness, or dizziness. She feels well and wants to go home. She denies recent illnesses, including fever, chills, cough, nausea, vomiting, or change in bowel or urinary habits. She has a remote history of cardiac disease. There are no other known modifying factors.  Patient is a 79 y.o. female presenting with shortness of breath. The history is provided by the patient.  Shortness of Breath   Past Medical History  Diagnosis Date  . Essential hypertension   . CAD (coronary artery disease) 2005    NSTEMI 06/2011, cath showed chronically occluded RCA with good collaterals from the left system and no obstructive disease in the LAD the circumflex, nl LV fxn  . Aortic stenosis   . Spinal stenosis 2008  . Polio 1931    "made my left arm weak since" (05/01/2012)  . Hypothyroidism   . Pneumonia 1923; 1959; 1981; 2011  . B12 deficiency anemia   . Carotid artery stenosis 2005    right  . Complication of anesthesia     "I panic" (05/01/2012)  . High cholesterol   . Varicose veins     BLE "since the 1940's" (05/01/2012)  . Myocardial infarction ~ 2011  . Chronic bronchitis     "~ q winter" (05/01/2012)  . GERD (gastroesophageal reflux disease)   . Arthritis     "all over my body" (05/01/2012)   Past Surgical History  Procedure Laterality Date  . Joint replacement    . Shoulder debridement    . Total knee arthroplasty  2000  . Thyroidectomy    . Bunionectomy  2004    "and big toe repair; right" (05/01/2012)  . Belpharoptosis repair  10/2007     bilaterally  . Tonsillectomy  1932  . Appendectomy  1940  . Ganglion cyst excision  1955    right hand  . Dilation and curettage of uterus  1969; 1974  . Cholecystectomy  1983  . Knee arthroscopy  1990    left  . Colonoscopy  1988; 1989; 1994; 1999    "all clear"  . Cataract extraction, bilateral  [id number]  . Total hip arthroplasty  1999    right  . Cardiac catheterization  2006?   Family History  Problem Relation Age of Onset  . Coronary artery disease Father   . Coronary artery disease Brother   . Coronary artery disease Brother    History  Substance Use Topics  . Smoking status: Former Smoker -- 0.50 packs/day for 22 years    Types: Cigarettes    Quit date: 07/30/1962  . Smokeless tobacco: Never Used  . Alcohol Use: 8.4 oz/week    7 Glasses of wine, 7 Shots of liquor per week     Comment: 05/01/2012 "2 drinks/day; wine or liquor"   OB History   Grav Para Term Preterm Abortions TAB SAB Ect Mult Living                 Review of Systems  Respiratory: Positive for shortness of breath.   All other systems reviewed and are negative.    Allergies  Codeine  Home Medications   Current Outpatient Rx  Name  Route  Sig  Dispense  Refill  . acetaminophen (TYLENOL ARTHRITIS PAIN) 650 MG CR tablet   Oral   Take 1,300 mg by mouth 2 (two) times daily. For pain         . albuterol (PROVENTIL HFA;VENTOLIN HFA) 108 (90 BASE) MCG/ACT inhaler   Inhalation   Inhale 2 puffs into the lungs daily as needed for wheezing or shortness of breath.          Marland Kitchen aspirin EC 81 MG tablet   Oral   Take 1 tablet (81 mg total) by mouth daily.   90 tablet   3   . B Complex-C (B-COMPLEX WITH VITAMIN C) tablet   Oral   Take 1 tablet by mouth daily.           . benazepril (LOTENSIN) 10 MG tablet   Oral   Take 10 mg by mouth daily.          . cholecalciferol (VITAMIN D) 1000 UNITS tablet   Oral   Take 1,000 Units by mouth daily.           . famotidine (PEPCID) 20 MG  tablet   Oral   Take 20 mg by mouth 2 (two) times daily.         . fexofenadine (ALLEGRA) 60 MG tablet   Oral   Take 60 mg by mouth 2 (two) times daily.         . furosemide (LASIX) 20 MG tablet   Oral   Take 30 mg by mouth 2 (two) times daily.          . Glucosamine-Chondroitin (OSTEO BI-FLEX REGULAR STRENGTH PO)   Oral   Take 1 capsule by mouth daily.         . Lecithin 400 MG CAPS   Oral   Take 1 capsule by mouth 2 (two) times daily.         Marland Kitchen levothyroxine (SYNTHROID, LEVOTHROID) 88 MCG tablet   Oral   Take 88 mcg by mouth daily.           Marland Kitchen lidocaine (LIDODERM) 5 %   Transdermal   Place 1 patch onto the skin daily as needed (pain). Remove & Discard patch within 12 hours or as directed by MD         . Melatonin 3 MG TABS   Oral   Take 3 mg by mouth at bedtime.          . Menthol, Topical Analgesic, (ARCTIC RELIEF EX)   Apply externally   Apply 1 spray topically every 3 (three) hours as needed (pain).         . metoprolol (TOPROL-XL) 50 MG 24 hr tablet   Oral   Take 50 mg by mouth daily.          . Multiple Vitamin (MULTIVITAMIN WITH MINERALS) TABS tablet   Oral   Take 1 tablet by mouth daily with lunch.         . nitroGLYCERIN (NITROSTAT) 0.4 MG SL tablet   Sublingual   Place 0.4 mg under the tongue every 5 (five) minutes as needed for chest pain.         . polyethylene glycol (MIRALAX / GLYCOLAX) packet   Oral   Take 17 g by mouth daily as needed (constipation).          . potassium chloride (K-DUR) 10 MEQ tablet   Oral  Take 20 mEq by mouth 2 (two) times daily. Take with Lasix (furosemide)         . pravastatin (PRAVACHOL) 40 MG tablet   Oral   Take 40 mg by mouth at bedtime.         . psyllium (METAMUCIL) 58.6 % powder   Oral   Take 1 packet by mouth daily.          . vitamin B-12 (CYANOCOBALAMIN) 1000 MCG tablet   Oral   Take 1,000 mcg by mouth daily.           . vitamin E 100 UNIT capsule   Oral   Take  100 Units by mouth daily.            BP 124/60  Pulse 72  Temp(Src) 97.8 F (36.6 C) (Oral)  Resp 18  Wt 158 lb 1.6 oz (71.714 kg)  SpO2 97% Physical Exam  Nursing note and vitals reviewed. Constitutional: She is oriented to person, place, and time. She appears well-developed and well-nourished.  HENT:  Head: Normocephalic and atraumatic.  Eyes: Conjunctivae and EOM are normal. Pupils are equal, round, and reactive to light.  Neck: Normal range of motion and phonation normal. Neck supple.  Cardiovascular: Normal rate, regular rhythm and intact distal pulses.   Pulmonary/Chest: Effort normal. No respiratory distress. She has no wheezes. She exhibits no tenderness.  Decreased amount of bilateral bases  Abdominal: Soft. She exhibits no distension. There is no tenderness. There is no guarding.  Musculoskeletal: Normal range of motion.  Neurological: She is alert and oriented to person, place, and time. She exhibits normal muscle tone.  Skin: Skin is warm and dry.  Psychiatric: She has a normal mood and affect. Her behavior is normal.    ED Course  Procedures (including critical care time)  Patient Vitals for the past 24 hrs:  BP Temp Temp src Pulse Resp SpO2 Weight  07/28/13 2147 124/60 mmHg - - 72 18 97 % -  07/28/13 2014 - - - - - - 158 lb 1.6 oz (71.714 kg)  07/28/13 1921 99/52 mmHg 97.8 F (36.6 C) Oral 74 16 100 % -    9:53 PM-Consult complete with cardiology. Patient case explained and discussed. He agrees to admit patient for further evaluation and treatment. Call ended at 2159   Labs Review Labs Reviewed  PRO B NATRIURETIC PEPTIDE - Abnormal; Notable for the following:    Pro B Natriuretic peptide (BNP) 5796.0 (*)    All other components within normal limits  POCT I-STAT TROPONIN I - Abnormal; Notable for the following:    Troponin i, poc 2.35 (*)    All other components within normal limits   Imaging Review Dg Chest Port 1 View  07/28/2013   CLINICAL DATA:   Shortness of breath.  Congestive heart failure.  EXAM: PORTABLE CHEST - 1 VIEW  COMPARISON:  04/24/2013  FINDINGS: Shallow inspiration. Mild cardiac enlargement. Pulmonary vascularity appears normal. Calcification in the mitral valve annulus. Calcified and tortuous aorta. No focal airspace disease or consolidation in the lungs. Slight interstitial changes in the lung bases are probably due to fibrosis. No pneumothorax. No pleural effusions. Degenerative changes in the shoulders. No significant change since prior study.  IMPRESSION: No active disease.   Electronically Signed   By: Burman Nieves M.D.   On: 07/28/2013 21:21     Date: 07/28/13- 18:44  Rate: 69  Rhythm: normal sinus rhythm  QRS Axis: normal  PR and QT Intervals:  normal  ST/T Wave abnormalities: normal  PR and QRS Conduction Disutrbances:none  Narrative Interpretation:   Old EKG Reviewed: unchanged   EKG Interpretation    Date/Time:  Tuesday July 28 2013 21:40:25 EST Ventricular Rate:  74 PR Interval:  169 QRS Duration: 106 QT Interval:  394 QTC Calculation: 437 R Axis:   23 Text Interpretation:  Sinus rhythm Consider anterior infarct Since last tracing of earlier today no change Confirmed by Brylan Dec  MD, Holly Gibson ([id number]) on 07/28/2013 9:47:15 PM            MDM   1. NSTEMI (non-ST elevated myocardial infarction)   2. CHF (congestive heart failure)      Evaluation is consistent with non-STEMI with congestive heart failure. EKG repeated does not show change in status. Chest x-ray does not  indicate failure, but her BNP is markedly elevated from her baseline. She blood pressure improved with time. Small dose of Lasix ordered to help diuresis. Consultation with Cardiology to help arrange evaluation and admission.    Nursing Notes Reviewed/ Care Coordinated, and agree without changes. Applicable Imaging Reviewed.  Interpretation of Laboratory Data incorporated into ED treatment   Plan : Admit    Flint Melter, MD 07/28/13 2159

## 2013-07-28 NOTE — H&P (Signed)
Cardiology History and Physical  Emeterio Reeve, MD  History of Present Illness (and review of medical records): Holly Gibson is a 79 y.o. female who presents for evaluation of shortness of breath.  She is followed by Dr. Jens Som for CAD and AS. Cardiac catheterization in December of 2012 revealed minor irregularities in the left main. There is a 25% LAD. There were proximal luminal irregularities in the circumflex. The right coronary artery was noted to be occluded. There was collateralization from the LAD and circumflex. The ejection fraction was 65%. Medical therapy recommended. VQ scan was very low probability. Echocardiogram in December of 2013 showed an ejection fraction of 55-60%, mild left ventricular hypertrophy, grade 1 diastolic dysfunction, moderate aortic stenosis with a mean gradient of 32 mm mercury and moderate left atrial enlargement. Patient also appears to have a history of chronically elevated troponin.   She now presents with shortness of breath with exertion over the past 2-3 days.  She normally walks 1-51miles without any problems.  She noticed however she had difficulty with her usual routine.  Today while PT was with her, they noticed that she had difficulty breathing.  Patient stated today was worse than usual and got progressivley worse and had to stop exercise.  She denied any pain.  She denied any recent PND, orthopnea, or LE swelling.  PT suggested she call her PCP who recommended further evaluation in the ED.  EMS was called and she was brought to the ED.  She remains chest pain free.  Initial troponin was 2.35 and BNP 5796.  Review of Systems Further review of systems was otherwise negative other than stated in HPI.  Patient Active Problem List   Diagnosis Date Noted  . CHF (congestive heart failure) 07/28/2013  . Elevated troponin 05/03/2012  . Bronchitis 05/03/2012  . Edema 02/19/2012  . Hyperlipidemia 02/19/2012  . CAD (coronary artery disease) 08/06/2011  .  Aortic stenosis 08/06/2011  . Hypertension 08/06/2011  . Hypothyroid 08/06/2011  . NSTEMI (non-ST elevated myocardial infarction) 07/01/2011   Past Medical History  Diagnosis Date  . Essential hypertension   . CAD (coronary artery disease) 2005    NSTEMI 06/2011, cath showed chronically occluded RCA with good collaterals from the left system and no obstructive disease in the LAD the circumflex, nl LV fxn  . Aortic stenosis   . Spinal stenosis 2008  . Polio 1931    "made my left arm weak since" (05/01/2012)  . Hypothyroidism   . Pneumonia 1923; 1959; 1981; 2011  . B12 deficiency anemia   . Carotid artery stenosis 2005    right  . Complication of anesthesia     "I panic" (05/01/2012)  . High cholesterol   . Varicose veins     BLE "since the 1940's" (05/01/2012)  . Myocardial infarction ~ 2011  . Chronic bronchitis     "~ q winter" (05/01/2012)  . GERD (gastroesophageal reflux disease)   . Arthritis     "all over my body" (05/01/2012)    Past Surgical History  Procedure Laterality Date  . Joint replacement    . Shoulder debridement    . Total knee arthroplasty  2000  . Thyroidectomy    . Bunionectomy  2004    "and big toe repair; right" (05/01/2012)  . Belpharoptosis repair  10/2007    bilaterally  . Tonsillectomy  1932  . Appendectomy  1940  . Ganglion cyst excision  1955    right hand  . Dilation and curettage of uterus  1969; 1974  . Cholecystectomy  1983  . Knee arthroscopy  1990    left  . Colonoscopy  1988; 1989; 1994; 1999    "all clear"  . Cataract extraction, bilateral  [id number]  . Total hip arthroplasty  1999    right  . Cardiac catheterization  2006?    Prescriptions prior to admission  Medication Sig Dispense Refill  . acetaminophen (TYLENOL ARTHRITIS PAIN) 650 MG CR tablet Take 1,300 mg by mouth 2 (two) times daily. For pain      . albuterol (PROVENTIL HFA;VENTOLIN HFA) 108 (90 BASE) MCG/ACT inhaler Inhale 2 puffs into the lungs daily as needed for  wheezing or shortness of breath.       Marland Kitchen aspirin EC 81 MG tablet Take 1 tablet (81 mg total) by mouth daily.  90 tablet  3  . B Complex-C (B-COMPLEX WITH VITAMIN C) tablet Take 1 tablet by mouth daily.        . benazepril (LOTENSIN) 10 MG tablet Take 10 mg by mouth daily.       . cholecalciferol (VITAMIN D) 1000 UNITS tablet Take 1,000 Units by mouth daily.        . famotidine (PEPCID) 20 MG tablet Take 20 mg by mouth 2 (two) times daily.      . fexofenadine (ALLEGRA) 60 MG tablet Take 60 mg by mouth 2 (two) times daily.      . furosemide (LASIX) 20 MG tablet Take 30 mg by mouth 2 (two) times daily.       . Glucosamine-Chondroitin (OSTEO BI-FLEX REGULAR STRENGTH PO) Take 1 capsule by mouth daily.      . Lecithin 400 MG CAPS Take 1 capsule by mouth 2 (two) times daily.      Marland Kitchen levothyroxine (SYNTHROID, LEVOTHROID) 88 MCG tablet Take 88 mcg by mouth daily.        Marland Kitchen lidocaine (LIDODERM) 5 % Place 1 patch onto the skin daily as needed (pain). Remove & Discard patch within 12 hours or as directed by MD      . Melatonin 3 MG TABS Take 3 mg by mouth at bedtime.       . Menthol, Topical Analgesic, (ARCTIC RELIEF EX) Apply 1 spray topically every 3 (three) hours as needed (pain).      . metoprolol (TOPROL-XL) 50 MG 24 hr tablet Take 50 mg by mouth daily.       . Multiple Vitamin (MULTIVITAMIN WITH MINERALS) TABS tablet Take 1 tablet by mouth daily with lunch.      . nitroGLYCERIN (NITROSTAT) 0.4 MG SL tablet Place 0.4 mg under the tongue every 5 (five) minutes as needed for chest pain.      . polyethylene glycol (MIRALAX / GLYCOLAX) packet Take 17 g by mouth daily as needed (constipation).       . potassium chloride (K-DUR) 10 MEQ tablet Take 20 mEq by mouth 2 (two) times daily. Take with Lasix (furosemide)      . pravastatin (PRAVACHOL) 40 MG tablet Take 40 mg by mouth at bedtime.      . psyllium (METAMUCIL) 58.6 % powder Take 1 packet by mouth daily.       . vitamin B-12 (CYANOCOBALAMIN) 1000 MCG tablet  Take 1,000 mcg by mouth daily.        . vitamin E 100 UNIT capsule Take 100 Units by mouth daily.         Allergies  Allergen Reactions  . Codeine Anaphylaxis, Nausea And Vomiting and Rash  History  Substance Use Topics  . Smoking status: Former Smoker -- 0.50 packs/day for 22 years    Types: Cigarettes    Quit date: 07/30/1962  . Smokeless tobacco: Never Used  . Alcohol Use: 8.4 oz/week    7 Glasses of wine, 7 Shots of liquor per week     Comment: 05/01/2012 "2 drinks/day; wine or liquor"    Family History  Problem Relation Age of Onset  . Coronary artery disease Father   . Coronary artery disease Brother   . Coronary artery disease Brother      Objective:  Patient Vitals for the past 8 hrs:  BP Temp Temp src Pulse Resp SpO2 Weight  07/29/13 0038 - - - - - - 70.6 kg (155 lb 10.3 oz)  07/28/13 2330 109/54 mmHg - - 71 24 96 % -  07/28/13 2300 100/52 mmHg - - 71 19 98 % -  07/28/13 2230 108/63 mmHg - - 74 18 96 % -  07/28/13 2200 112/53 mmHg - - 70 21 95 % -  07/28/13 2147 124/60 mmHg - - 72 18 97 % -  07/28/13 2100 103/77 mmHg - - 83 20 99 % -  07/28/13 2030 101/44 mmHg - - 77 22 98 % -  07/28/13 2014 - - - - - - 71.714 kg (158 lb 1.6 oz)  07/28/13 1945 111/46 mmHg - - 73 20 99 % -  07/28/13 1921 99/52 mmHg 97.8 F (36.6 C) Oral 74 16 100 % -  07/28/13 1915 99/52 mmHg - - 67 16 100 % -   General appearance: alert, cooperative, elderly appearing female in NAD Head: Normocephalic, without obvious abnormality, atraumatic Eyes: conjunctivae/corneas clear. PERRL, EOM's intact. Fundi benign. Neck: no carotid bruit, no JVD and supple, symmetrical, trachea midline Lungs: clear to auscultation bilaterally Chest wall: no tenderness Heart: regular rate and rhythm, S1, S2 normal, 3/6 systolic murmur Abdomen: soft, non-tender; bowel sounds normal; no masses,  no organomegaly Extremities: extremities normal, atraumatic, no cyanosis or edema Pulses: 2+ and symmetric Neurologic:  Grossly normal  Results for orders placed during the hospital encounter of 07/28/13 (from the past 48 hour(s))  PRO B NATRIURETIC PEPTIDE     Status: Abnormal   Collection Time    07/28/13  7:58 PM      Result Value Range   Pro B Natriuretic peptide (BNP) 5796.0 (*) 0 - 450 pg/mL  POCT I-STAT TROPONIN I     Status: Abnormal   Collection Time    07/28/13  8:19 PM      Result Value Range   Troponin i, poc 2.35 (*) 0.00 - 0.08 ng/mL   Comment NOTIFIED PHYSICIAN     Comment 3            Comment: Due to the release kinetics of cTnI,     a negative result within the first hours     of the onset of symptoms does not rule out     myocardial infarction with certainty.     If myocardial infarction is still suspected,     repeat the test at appropriate intervals.   Dg Chest Port 1 View  07/28/2013   CLINICAL DATA:  Shortness of breath.  Congestive heart failure.  EXAM: PORTABLE CHEST - 1 VIEW  COMPARISON:  04/24/2013  FINDINGS: Shallow inspiration. Mild cardiac enlargement. Pulmonary vascularity appears normal. Calcification in the mitral valve annulus. Calcified and tortuous aorta. No focal airspace disease or consolidation in the lungs. Slight interstitial  changes in the lung bases are probably due to fibrosis. No pneumothorax. No pleural effusions. Degenerative changes in the shoulders. No significant change since prior study.  IMPRESSION: No active disease.   Electronically Signed   By: Burman Nieves M.D.   On: 07/28/2013 21:21    ECG:  Baseline artifact, sinus rhythm, poor r wave progression, no acute ischemic changes, no significant change from prior.  Assessment: CHF exacerbation Positive troponin, likely demand, has known prior elevated troponins  CAD Aortic Stenosis  Plan:  1. Cardiology Admission  2. Continuous monitoring on Telemetry. 3. Repeat ekg on admit, prn chest pain or arrythmia 4. Trend cardiac biomarkers,  5. Medical management to include ASA, BB, ACEI,  statin 6. Gentle diuresis, strict I/Os, 7. No anticoagulation at this time 8. Will repeat 2D Echo, last was 12/13. 9. DNR

## 2013-07-28 NOTE — ED Notes (Signed)
Placed patient on bedpan, and patient was able to urinate

## 2013-07-29 ENCOUNTER — Encounter (HOSPITAL_COMMUNITY): Payer: Self-pay

## 2013-07-29 DIAGNOSIS — I359 Nonrheumatic aortic valve disorder, unspecified: Secondary | ICD-10-CM

## 2013-07-29 DIAGNOSIS — I509 Heart failure, unspecified: Secondary | ICD-10-CM

## 2013-07-29 DIAGNOSIS — I5033 Acute on chronic diastolic (congestive) heart failure: Principal | ICD-10-CM

## 2013-07-29 LAB — CREATININE, SERUM
Creatinine, Ser: 1.04 mg/dL (ref 0.50–1.10)
GFR calc Af Amer: 52 mL/min — ABNORMAL LOW (ref >90)

## 2013-07-29 LAB — CBC
HCT: 32.7 % — ABNORMAL LOW (ref 36.0–46.0)
MCV: 101.9 fL — ABNORMAL HIGH (ref 78.0–100.0)
Platelets: 164 10*3/uL (ref 150–400)
RBC: 3.21 MIL/uL — ABNORMAL LOW (ref 3.87–5.11)
RDW: 12.9 % (ref 11.5–15.5)
WBC: 6.4 10*3/uL (ref 4.0–10.5)

## 2013-07-29 LAB — MAGNESIUM: Magnesium: 2.1 mg/dL (ref 1.5–2.5)

## 2013-07-29 LAB — BASIC METABOLIC PANEL
CO2: 23 mEq/L (ref 19–32)
Chloride: 104 mEq/L (ref 96–112)
Creatinine, Ser: 1.04 mg/dL (ref 0.50–1.10)
GFR calc Af Amer: 52 mL/min — ABNORMAL LOW (ref >90)
Potassium: 4.5 mEq/L (ref 3.7–5.3)
Sodium: 140 mEq/L (ref 137–147)

## 2013-07-29 LAB — TROPONIN I: Troponin I: 1.99 ng/mL (ref <0.30)

## 2013-07-29 MED ORDER — ALBUTEROL SULFATE HFA 108 (90 BASE) MCG/ACT IN AERS
2.0000 | INHALATION_SPRAY | Freq: Every day | RESPIRATORY_TRACT | Status: DC | PRN
  Filled 2013-07-29: qty 6.7

## 2013-07-29 MED ORDER — SODIUM CHLORIDE 0.9 % IV SOLN: 250.0000 mL | INTRAVENOUS | Status: DC | PRN

## 2013-07-29 MED ORDER — FUROSEMIDE 10 MG/ML IJ SOLN
40.0000 mg | Freq: Two times a day (BID) | INTRAMUSCULAR | Status: DC
  Administered 2013-07-29 – 2013-07-30 (×2): 40 mg via INTRAVENOUS
  Filled 2013-07-29 (×4): qty 4

## 2013-07-29 MED ORDER — ACETAMINOPHEN 325 MG PO TABS: 650.0000 mg | ORAL_TABLET | ORAL | Status: DC | PRN

## 2013-07-29 MED ORDER — SODIUM CHLORIDE 0.9 % IJ SOLN
3.0000 mL | Freq: Two times a day (BID) | INTRAMUSCULAR | Status: DC
  Administered 2013-07-29 (×3): 3 mL via INTRAVENOUS

## 2013-07-29 MED ORDER — ASPIRIN EC 81 MG PO TBEC
81.0000 mg | DELAYED_RELEASE_TABLET | Freq: Every day | ORAL | Status: DC
  Administered 2013-07-29 – 2013-07-30 (×2): 81 mg via ORAL
  Filled 2013-07-29 (×2): qty 1

## 2013-07-29 MED ORDER — POLYETHYLENE GLYCOL 3350 17 G PO PACK
17.0000 g | PACK | Freq: Every day | ORAL | Status: DC | PRN
  Filled 2013-07-29: qty 1

## 2013-07-29 MED ORDER — SIMVASTATIN 20 MG PO TABS
20.0000 mg | ORAL_TABLET | Freq: Every day | ORAL | Status: DC
  Administered 2013-07-29: 20 mg via ORAL
  Filled 2013-07-29 (×2): qty 1

## 2013-07-29 MED ORDER — HEPARIN SODIUM (PORCINE) 5000 UNIT/ML IJ SOLN
5000.0000 [IU] | Freq: Three times a day (TID) | INTRAMUSCULAR | Status: DC
  Administered 2013-07-29 – 2013-07-30 (×4): 5000 [IU] via SUBCUTANEOUS
  Filled 2013-07-29 (×7): qty 1

## 2013-07-29 MED ORDER — METOPROLOL SUCCINATE ER 50 MG PO TB24
50.0000 mg | ORAL_TABLET | Freq: Every day | ORAL | Status: DC
  Administered 2013-07-29 – 2013-07-30 (×2): 50 mg via ORAL
  Filled 2013-07-29 (×2): qty 1

## 2013-07-29 MED ORDER — ADULT MULTIVITAMIN W/MINERALS CH
1.0000 | ORAL_TABLET | Freq: Every day | ORAL | Status: DC
  Administered 2013-07-29 – 2013-07-30 (×2): 1 via ORAL
  Filled 2013-07-29 (×2): qty 1

## 2013-07-29 MED ORDER — NITROGLYCERIN 0.4 MG SL SUBL: 0.4000 mg | SUBLINGUAL_TABLET | SUBLINGUAL | Status: DC | PRN

## 2013-07-29 MED ORDER — LEVOTHYROXINE SODIUM 88 MCG PO TABS
88.0000 ug | ORAL_TABLET | Freq: Every day | ORAL | Status: DC
  Administered 2013-07-29 – 2013-07-30 (×2): 88 ug via ORAL
  Filled 2013-07-29 (×3): qty 1

## 2013-07-29 MED ORDER — ONDANSETRON HCL 4 MG/2ML IJ SOLN: 4.0000 mg | Freq: Four times a day (QID) | INTRAMUSCULAR | Status: DC | PRN

## 2013-07-29 MED ORDER — FUROSEMIDE 10 MG/ML IJ SOLN
40.0000 mg | Freq: Once | INTRAMUSCULAR | Status: AC
  Administered 2013-07-29: 40 mg via INTRAVENOUS
  Filled 2013-07-29: qty 4

## 2013-07-29 MED ORDER — BENAZEPRIL HCL 10 MG PO TABS
10.0000 mg | ORAL_TABLET | Freq: Every day | ORAL | Status: DC
  Administered 2013-07-29 – 2013-07-30 (×2): 10 mg via ORAL
  Filled 2013-07-29 (×2): qty 1

## 2013-07-29 MED ORDER — SODIUM CHLORIDE 0.9 % IJ SOLN: 3.0000 mL | INTRAMUSCULAR | Status: DC | PRN

## 2013-07-29 NOTE — Progress Notes (Signed)
CSW spoke to [place] to confirm that patient is not from ALF, patient is from Independent Living and the plan is to return back when medically ready. CSW signing off at this time. Please re consult if social work needs arise.  Maree Krabbe, MSW, Theresia Majors [phone]

## 2013-07-29 NOTE — Care Management Note (Unsigned)
    Page 1 of 1   07/29/2013     4:44:25 PM   CARE MANAGEMENT NOTE 07/29/2013  Patient:  Holly Gibson, Holly Gibson   Account Number:  [id number]  Date Initiated:  07/29/2013  Documentation initiated by:  Will Schier  Subjective/Objective Assessment:   PT ADM ON 12/30 WITH CHF EXAC.  PTA, PT INDEPENDENT, LIVES AT [facility].     Action/Plan:   P.T. EVAL TODAY; STATES PT IS OK TO RETURN TO FACILITY WITH HHPT FOLLOW UP AS PRIOR TO ADMISSION.   Anticipated DC Date:  08/01/2013   Anticipated DC Plan:  HOME W HOME HEALTH SERVICES      DC Planning Services  CM consult      Choice offered to / List presented to:             Status of service:  In process, will continue to follow Medicare Important Message given?   (If response is "NO", the following Medicare IM given date fields will be blank) Date Medicare IM given:   Date Additional Medicare IM given:    Discharge Disposition:    Per UR Regulation:  Reviewed for med. necessity/level of care/duration of stay  If discussed at Long Length of Stay Meetings, dates discussed:    Comments:  07/29/13 Ercil Cassis,RN,BSN [phone] MD, PLEASE ORDER HHPT AS RECOMMENDED, UPON DC.  PT HAS HX OF [facility], AND PREFERS TO USE THIS AGENCY FOR HH SERVICES.

## 2013-07-29 NOTE — Progress Notes (Signed)
  Echocardiogram 2D Echocardiogram has been performed.  Holly Gibson 07/29/2013, 12:12 PM

## 2013-07-29 NOTE — Progress Notes (Signed)
UR Completed.  Vangie Bicker [phone] 07/29/2013

## 2013-07-29 NOTE — Evaluation (Signed)
Physical Therapy Evaluation Patient Details Name: Holly Gibson MRN: [id number] DOB: Apr 09, 1919 Today's Date: 07/29/2013 Time: 5621-3086 PT Time Calculation (min): 25 min  PT Assessment / Plan / Recommendation History of Present Illness  Pt comes in with SOB  Clinical Impression  Patient from Independent living, was ambulating with cane but does have rollator walker. Patient demonstrates deficits in functional mobility as indicated below. Will benefit from continued skilled PT to address deficits and maximize function. Will see as indicated and progress activity as tolerated.     PT Assessment  Patient needs continued PT services    Follow Up Recommendations  Home health PT (return to ILF with HHPT resumed)          Equipment Recommendations  None recommended by PT       Frequency Min 4X/week    Precautions / Restrictions Restrictions Other Position/Activity Restrictions: has residula deficits from polio LUE   Pertinent Vitals/Pain SpO2 >95% throughout session, patient with some initial dizziness upon coming to standing position, sat EOB several minutes and this resolved. Will continue to monitor      Mobility  Bed Mobility Bed Mobility: Supine to Sit;Sitting - Scoot to Edge of Bed Supine to Sit: 5: Supervision Sitting - Scoot to [place] of Bed: 5: Supervision Details for Bed Mobility Assistance: Increased time to perform Transfers Transfers: Sit to Stand;Stand to Sit Sit to Stand: 4: Min guard Stand to Sit: 5: Supervision Details for Transfer Assistance: Increased time to perform, min guard secondary to initial "woozy' feeling with standing Ambulation/Gait Ambulation/Gait Assistance: 5: Supervision;4: Min guard Ambulation Distance (Feet): 180 Feet Assistive device: 1 person hand held assist Ambulation/Gait Assistance Details: may benefit from use of can or RW, uses cane at baseline, was using rollator but has progressed off of it Gait Pattern: Step-through  pattern;Decreased stride length;Trunk flexed;Narrow base of support Gait velocity: decreased        PT Diagnosis: Difficulty walking;Generalized weakness  PT Problem List: Decreased strength;Decreased activity tolerance;Cardiopulmonary status limiting activity PT Treatment Interventions: DME instruction;Gait training;Functional mobility training;Therapeutic activities;Therapeutic exercise;Balance training;Patient/family education     PT Goals(Current goals can be found in the care plan section) Acute Rehab PT Goals Patient Stated Goal: to go back home PT Goal Formulation: With patient Time For Goal Achievement: 08/12/13 Potential to Achieve Goals: Good  Visit Information  Last PT Received On: 07/29/13 Assistance Needed: +1 History of Present Illness: Pt comes in with SOB       Prior Functioning  Home Living Family/patient expects to be discharged to:: Assisted living (Independent living) Home Equipment: Environmental consultant - 4 wheels;Cane - single point Prior Function Level of Independence: Independent Dominant Hand: Right    Cognition  Cognition Arousal/Alertness: Awake/alert Behavior During Therapy: WFL for tasks assessed/performed Overall Cognitive Status: Within Functional Limits for tasks assessed    Extremity/Trunk Assessment Upper Extremity Assessment Upper Extremity Assessment: Defer to OT evaluation Lower Extremity Assessment Lower Extremity Assessment: Generalized weakness   Balance High Level Balance High Level Balance Activites: Side stepping;Backward walking;Direction changes;Turns High Level Balance Comments: uses furniture to assist while navigating in room to chair  End of Session PT - End of Session Equipment Utilized During Treatment: Gait belt Activity Tolerance: Patient tolerated treatment well Patient left: in chair;with call bell/phone within reach Nurse Communication: Mobility status  GP     Fabio Asa 07/29/2013, 2:58 PM Charlotte Crumb, PT DPT   [phone]

## 2013-07-29 NOTE — Progress Notes (Signed)
SUBJECTIVE:  She has not ambulated so she is not sure whether she is SOB.  No pain.     PHYSICAL EXAM Filed Vitals:   07/29/13 0038 07/29/13 0049 07/29/13 0448 07/29/13 0500  BP:  103/42 102/50   Pulse:  72 68   Temp:  98 F (36.7 C) 98.2 F (36.8 C)   TempSrc:  Oral Oral   Resp:  20 20   Height:  5' (1.524 m)    Weight: 155 lb 10.3 oz (70.6 kg)   155 lb 10.3 oz (70.6 kg)  SpO2:  99% 96%    General:  No distress Lungs:  Clear Heart:  RRR, systolic murmur Abdomen:  Positive bowel sounds, no rebound no guarding Extremities:  No edema  LABS: Lab Results  Component Value Date   TROPONINI 2.05* 07/29/2013   Results for orders placed during the hospital encounter of 07/28/13 (from the past 24 hour(s))  PRO B NATRIURETIC PEPTIDE     Status: Abnormal   Collection Time    07/28/13  7:58 PM      Result Value Range   Pro B Natriuretic peptide (BNP) 5796.0 (*) 0 - 450 pg/mL  POCT I-STAT TROPONIN I     Status: Abnormal   Collection Time    07/28/13  8:19 PM      Result Value Range   Troponin i, poc 2.35 (*) 0.00 - 0.08 ng/mL   Comment NOTIFIED PHYSICIAN     Comment 3           BASIC METABOLIC PANEL     Status: Abnormal   Collection Time    07/29/13  1:25 AM      Result Value Range   Sodium 140  137 - 147 mEq/L   Potassium 4.5  3.7 - 5.3 mEq/L   Chloride 104  96 - 112 mEq/L   CO2 23  19 - 32 mEq/L   Glucose, Bld 86  70 - 99 mg/dL   BUN 32 (*) 6 - 23 mg/dL   Creatinine, Ser 9.60  0.50 - 1.10 mg/dL   Calcium 9.0  8.4 - 45.4 mg/dL   GFR calc non Af Amer 45 (*) >90 mL/min   GFR calc Af Amer 52 (*) >90 mL/min  MAGNESIUM     Status: None   Collection Time    07/29/13  1:25 AM      Result Value Range   Magnesium 2.1  1.5 - 2.5 mg/dL  TROPONIN I     Status: Abnormal   Collection Time    07/29/13  1:25 AM      Result Value Range   Troponin I 1.99 (*) <0.30 ng/mL  CBC     Status: Abnormal   Collection Time    07/29/13  1:25 AM      Result Value Range   WBC 6.4  4.0 -  10.5 K/uL   RBC 3.21 (*) 3.87 - 5.11 MIL/uL   Hemoglobin 10.8 (*) 12.0 - 15.0 g/dL   HCT 09.8 (*) 11.9 - 14.7 %   MCV 101.9 (*) 78.0 - 100.0 fL   MCH 33.6  26.0 - 34.0 pg   MCHC 33.0  30.0 - 36.0 g/dL   RDW 82.9  56.2 - 13.0 %   Platelets 164  150 - 400 K/uL  CREATININE, SERUM     Status: Abnormal   Collection Time    07/29/13  1:25 AM      Result Value Range   Creatinine,  Ser 1.04  0.50 - 1.10 mg/dL   GFR calc non Af Amer 45 (*) >90 mL/min   GFR calc Af Amer 52 (*) >90 mL/min  TROPONIN I     Status: Abnormal   Collection Time    07/29/13  6:10 AM      Result Value Range   Troponin I 2.05 (*) <0.30 ng/mL    Intake/Output Summary (Last 24 hours) at 07/29/13 1020 Last data filed at 07/29/13 1610  Gross per 24 hour  Intake    240 ml  Output   1050 ml  Net   -810 ml     ASSESSMENT AND PLAN:  ACUTE ON CHRONIC DIASTOLIC HF:  I will continue diuresis today.  However, We need to be careful with her renal function.  Ambulate today.    ELEVATED TROPONIN:  Repeat EKG.    Echo pending.  I do not plan another cardiac cath as she has had no chest pain.    CAD:  As above.     AORTIC STENOSIS.  Echo pending.   Holly Gibson Holly Gibson 07/29/2013 10:20 AM

## 2013-07-30 ENCOUNTER — Encounter (HOSPITAL_COMMUNITY): Payer: Self-pay | Admitting: Nurse Practitioner

## 2013-07-30 DIAGNOSIS — I350 Nonrheumatic aortic (valve) stenosis: Secondary | ICD-10-CM

## 2013-07-30 LAB — BASIC METABOLIC PANEL
BUN: 34 mg/dL — ABNORMAL HIGH (ref 6–23)
CO2: 24 mEq/L (ref 19–32)
Calcium: 9.4 mg/dL (ref 8.4–10.5)
Chloride: 101 mEq/L (ref 96–112)
Creatinine, Ser: 1.08 mg/dL (ref 0.50–1.10)
GFR calc Af Amer: 50 mL/min — ABNORMAL LOW (ref >90)
GFR calc non Af Amer: 43 mL/min — ABNORMAL LOW (ref >90)
Glucose, Bld: 86 mg/dL (ref 70–99)
Potassium: 4 mEq/L (ref 3.7–5.3)
Sodium: 141 mEq/L (ref 137–147)

## 2013-07-30 NOTE — Progress Notes (Signed)
   CARE MANAGEMENT NOTE 07/30/2013  Patient:  Holly Gibson,Holly Gibson   Account Number:  [id number]  Date Initiated:  07/29/2013  Documentation initiated by:  Gibson,JULIE  Subjective/Objective Assessment:   PT ADM ON 12/30 WITH CHF EXAC.  PTA, PT INDEPENDENT, LIVES AT [place] [facility].     Action/Plan:   P.T. EVAL TODAY; STATES PT IS OK TO RETURN TO FACILITY WITH HHPT FOLLOW UP AS PRIOR TO ADMISSION.   Anticipated DC Date:  08/01/2013   Anticipated DC Plan:  HOME W HOME HEALTH SERVICES      DC Planning Services  CM consult      Choice offered to / List presented to:          [facility]  [facility]  [facility] Holly Gibson      [facility]   Status of service:  Completed, signed off Medicare Important Message given?   (If response is "NO", the following Medicare IM given date fields will be blank) Date Medicare IM given:   Date Additional Medicare IM given:    Discharge Disposition:  HOME W HOME HEALTH SERVICES  Per UR Regulation:  Reviewed for med. necessity/level of care/duration of stay  If discussed at Long Length of Stay Meetings, dates discussed:    Comments:  07/30/13 14:58 CM arranged HHPT as requested by pt through prior CM.  Order obtained for HHPT/Holly Gibson from discharging MD. No DME required per PT recc.  [facility].  No other CM needs were communicated. Holly Gibson, Holly Gibson, Caryl AdaM [phone].  07/29/13 Holly ChessmanJULIE AMERSON,Holly Gibson,Holly Gibson [phone] MD, PLEASE ORDER HHPT AS RECOMMENDED, UPON DC.  PT HAS HX OF [facility], AND PREFERS TO USE THIS AGENCY FOR HH SERVICES.

## 2013-07-30 NOTE — Progress Notes (Signed)
Physical Therapy Treatment Patient Details Name: Holly Gibson MRN: [id number] DOB: 10/01/1918 Today's Date: 07/30/2013 Time: [id number] PT Time Calculation (min): 9 min  PT Assessment / Plan / Recommendation  History of Present Illness Pt comes in with SOB   PT Comments   Patient doing well. She is highly motivated and ready to return to ILF today  Follow Up Recommendations  Home health PT     Does the patient have the potential to tolerate intense rehabilitation     Barriers to Discharge        Equipment Recommendations       Recommendations for Other Services    Frequency     Progress towards PT Goals Progress towards PT goals: Progressing toward goals  Plan Current plan remains appropriate    Precautions / Restrictions Restrictions Weight Bearing Restrictions: No   Pertinent Vitals/Pain no apparent distress     Mobility  Bed Mobility Bed Mobility: Not assessed Transfers Sit to Stand: 5: Supervision Stand to Sit: 5: Supervision Ambulation/Gait Ambulation/Gait Assistance: 5: Supervision;4: Min guard Ambulation Distance (Feet): 200 Feet Assistive device: None Gait Pattern: Step-through pattern;Decreased stride length;Trunk flexed;Narrow base of support    Exercises     PT Diagnosis:    PT Problem List:   PT Treatment Interventions:     PT Goals (current goals can now be found in the care plan section)    Visit Information  Last PT Received On: 07/30/13 Assistance Needed: +1 History of Present Illness: Pt comes in with SOB    Subjective Data      Cognition  Cognition Arousal/Alertness: Awake/alert Behavior During Therapy: WFL for tasks assessed/performed Overall Cognitive Status: Within Functional Limits for tasks assessed    Balance     End of Session PT - End of Session Equipment Utilized During Treatment: Gait belt Activity Tolerance: Patient tolerated treatment well Patient left: in chair;with call bell/phone within reach Nurse Communication:  Mobility status   GP     Fredrich BirksRobinette, Anh Bigos [name] 07/30/2013, 10:07 AM 07/30/2013 Fredrich Birksobinette, Teela Narducci [name] PTA [phone] pager [phone] office

## 2013-07-30 NOTE — Progress Notes (Signed)
   SUBJECTIVE:  Ambulated with PT yesterday.  Breathing at baseline.  She wants to go home.    PHYSICAL EXAM Filed Vitals:   07/29/13 1935 07/29/13 2204 07/30/13 0500 07/30/13 0503  BP: 95/32 100/58  91/43  Pulse: 85 91  67  Temp: 98.4 F (36.9 C)   97.9 F (36.6 C)  TempSrc: Oral   Oral  Resp: 16   16  Height:      Weight:   150 lb 9.6 oz (68.312 kg)   SpO2: 93%   94%   General:  No distress Lungs:  Clear Heart:  RRR, systolic murmur Abdomen:  Positive bowel sounds, no rebound no guarding Extremities:  No edema  LABS: Lab Results  Component Value Date   TROPONINI 2.05* 07/29/2013   No results found for this or any previous visit (from the past 24 hour(s)).  Intake/Output Summary (Last 24 hours) at 07/30/13 0752 Last data filed at 07/29/13 1724  Gross per 24 hour  Intake    720 ml  Output   1100 ml  Net   -380 ml   Echo:  Left ventricle: The cavity size was normal. Wall thickness was increased in a pattern of moderate LVH. The estimated ejection fraction was 55%. Apical hypokinesis. Doppler parameters are consistent with abnormal left ventricular relaxation (grade 1 diastolic dysfunction). - Aortic valve: Trileaflet; severely calcified leaflets. There was severe stenosis. Mean gradient: 38mm Hg (S). Peak gradient:4564mm Hg (S). Valve area: 0.74cm^2(VTI). - Mitral valve: Moderately calcified annulus. Moderately calcified leaflets . Mild regurgitation.   ASSESSMENT AND PLAN:  ACUTE ON CHRONIC DIASTOLIC HF:  She had some urine output yesterday with improved breathing.  She wants to go home.  Creat is stable.  She worked with PT and had 95% sat on RA throughout yesterday.   OK to discharge on previous meds.   ELEVATED TROPONIN:   Echo results as above.  It is not clear that the apical wall motion defect described is new.  It was not mentioned in the previous report.  However, I do not plan another cardiac cath as she has had no chest pain.  She agrees with conservative  management.  AS:  The gradient is slightly worse than previous.  However, i do not think it is severe enough to consider extraordinary therapy such as TAVR  CAD:  As above.    Fayrene FearingJames [facility] 07/30/2013 7:52 AM

## 2013-07-30 NOTE — Discharge Summary (Signed)
Discharge Summary   Patient ID: Holly [place],  MRN: [id number], DOB/AGE: 78/12/1918 78 y.o.  Admit date: 07/28/2013 Discharge date: 07/30/2013  Primary Care Provider: Mila PalmerWOLTERS,SHARON A Primary Cardiologist: B. Jens Somrenshaw, MD   Discharge Diagnoses Principal Problem:   Acute on chronic diastolic CHF (congestive heart failure), NYHA class 1  **Net negative diuresis of 880 ml this admission with reduction in weight from 155 lbs to 150 lbs.  Active Problems:   Severe aortic stenosis  **Echo this admission: Mean gradient: 38mm Hg (S).  Peak gradient:7464mm Hg (S). Valve area: 0.74cm^2(VTI).   CAD (coronary artery disease)  **Chronically occluded RCA by prior cath.   Elevated troponin  **Medical therapy in absence of chest pain.   Hypertension   Hyperlipidemia  Allergies Allergies  Allergen Reactions  . Codeine Anaphylaxis, Nausea And Vomiting and Rash   Procedures  2D Echocardiogram 12.31.2014  Study Conclusions  - Left ventricle: The cavity size was normal. Wall thickness   was increased in a pattern of moderate LVH. The estimated   ejection fraction was 55%. Apical hypokinesis. Doppler   parameters are consistent with abnormal left ventricular   relaxation (grade 1 diastolic dysfunction). - Aortic valve: Trileaflet; severely calcified leaflets.   There was severe stenosis. Mean gradient: 38mm Hg (S).   Peak gradient:5864mm Hg (S). Valve area: 0.74cm^2(VTI). - Mitral valve: Moderately calcified annulus. Moderately   calcified leaflets . Mild regurgitation. - Left atrium: The atrium was moderately dilated. - Right ventricle: The cavity size was normal. Systolic   function was normal. - Right atrium: The atrium was mildly dilated. - Tricuspid valve: Peak RV-RA gradient: 27mm Hg (S). - Pulmonary arteries: PA peak pressure: 32mm Hg (S). - Inferior vena cava: The vessel was normal in size; the   respirophasic diameter changes were in the normal range (=   50%); findings are  consistent with normal central venous   pressure. _____________   History of Present Illness  80 year old female with prior history of severe aortic stenosis, diastolic congestive heart failure, and coronary artery disease including an occluded right coronary artery who was in her usual state of health until approximately 2-3 days prior to admission which began to experience progressive dyspnea exertion. Because of progressive symptoms, she presented to the [facility] on December 30 were troponin was elevated at 2.35 and proBNP was elevated at 5796. ECG was nonacute. She was admitted for further evaluation and management of acute on chronic diastolic congestive heart failure with elevated troponins.  Hospital Course  Patient was placed on IV Lasix with good diuresis and symptomatic improvement. At no point did she have chest pain. A 2-D echocardiogram was performed and showed normal LV function with apical hypokinesis and grade 1 diastolic dysfunction. Severe aortic stenosis was noted with a valve area of 0.74 cm.  Though Ao Valve gradient was slightly worse than on previous evaluation, it was not felt that her AS was severe enough to consider extraordinary therapy such as TAVR.  Troponin rose and peaked at 2.05.  In the absence of chest pain and improvement in dyspnea with diuresis, continued, conservative medical therapy was recommended and patient was agreeable.  Her weight is down 5 lbs since admission and volume is felt to be stable.  She has been transitioned back to her previous home dose of lasix and will be discharged home today in good condition.  Discharge Vitals Blood pressure 91/43, pulse 67, temperature 97.9 F (36.6 C), temperature source Oral, resp. rate 16, height 5' (1.524 m), weight  150 lb 9.6 oz (68.312 kg), SpO2 94.00%.  Filed Weights   07/29/13 0038 07/29/13 0500 07/30/13 0500  Weight: 155 lb 10.3 oz (70.6 kg) 155 lb 10.3 oz (70.6 kg) 150 lb 9.6 oz (68.312 kg)     Labs  CBC  Recent Labs  07/29/13 0125  WBC 6.4  HGB 10.8*  HCT 32.7*  MCV 101.9*  PLT 164   Basic Metabolic Panel  Recent Labs  07/29/13 0125 07/30/13 0503  NA 140 141  K 4.5 4.0  CL 104 101  CO2 23 24  GLUCOSE 86 86  BUN 32* 34*  CREATININE 1.04  1.04 1.08  CALCIUM 9.0 9.4  MG 2.1  --    Cardiac Enzymes  Recent Labs  07/29/13 0125 07/29/13 0610  TROPONINI 1.99* 2.05*   Disposition  Pt is being discharged home today in good condition.  Follow-up Plans & Appointments      Follow-up Information   Follow up with Olga Millers, MD. (1-2 wks.  We will arrange and contact you.)    Specialty:  Cardiology   Contact information:   [address] [place] [address] [phone]       Follow up with Emeterio Reeve, MD. (as scheduled.)    Specialty:  Family Medicine   Contact information:   [name] [place] [address] [phone]      Discharge Medications    Medication List         albuterol 108 (90 BASE) MCG/ACT inhaler  Commonly known as:  PROVENTIL HFA;VENTOLIN HFA  Inhale 2 puffs into the lungs daily as needed for wheezing or shortness of breath.     ARCTIC RELIEF EX  Apply 1 spray topically every 3 (three) hours as needed (pain).     aspirin EC 81 MG tablet  Take 1 tablet (81 mg total) by mouth daily.     B-complex with vitamin C tablet  Take 1 tablet by mouth daily.     benazepril 10 MG tablet  Commonly known as:  LOTENSIN  Take 10 mg by mouth daily.     cholecalciferol 1000 UNITS tablet  Commonly known as:  VITAMIN D  Take 1,000 Units by mouth daily.     famotidine 20 MG tablet  Commonly known as:  PEPCID  Take 20 mg by mouth 2 (two) times daily.     fexofenadine 60 MG tablet  Commonly known as:  ALLEGRA  Take 60 mg by mouth 2 (two) times daily.     furosemide 20 MG tablet  Commonly known as:  LASIX  Take 30 mg by mouth 2 (two) times daily.     Lecithin 400 MG Caps   Take 1 capsule by mouth 2 (two) times daily.     levothyroxine 88 MCG tablet  Commonly known as:  SYNTHROID, LEVOTHROID  Take 88 mcg by mouth daily.     lidocaine 5 %  Commonly known as:  LIDODERM  Place 1 patch onto the skin daily as needed (pain). Remove & Discard patch within 12 hours or as directed by MD     Melatonin 3 MG Tabs  Take 3 mg by mouth at bedtime.     metoprolol succinate 50 MG 24 hr tablet  Commonly known as:  TOPROL-XL  Take 50 mg by mouth daily.     multivitamin with minerals Tabs tablet  Take 1 tablet by mouth daily with lunch.     nitroGLYCERIN 0.4 MG SL tablet  Commonly known as:  NITROSTAT  Place 0.4 mg under the tongue every 5 (five) minutes as needed for chest pain.     OSTEO BI-FLEX REGULAR STRENGTH PO  Take 1 capsule by mouth daily.     polyethylene glycol packet  Commonly known as:  MIRALAX / GLYCOLAX  Take 17 g by mouth daily as needed (constipation).     potassium chloride 10 MEQ tablet  Commonly known as:  K-DUR  Take 20 mEq by mouth 2 (two) times daily. Take with Lasix (furosemide)     pravastatin 40 MG tablet  Commonly known as:  PRAVACHOL  Take 40 mg by mouth at bedtime.     psyllium 58.6 % powder  Commonly known as:  METAMUCIL  Take 1 packet by mouth daily.     TYLENOL ARTHRITIS PAIN 650 MG CR tablet  Generic drug:  acetaminophen  Take 1,300 mg by mouth 2 (two) times daily. For pain     vitamin B-12 1000 MCG tablet  Commonly known as:  CYANOCOBALAMIN  Take 1,000 mcg by mouth daily.     vitamin E 100 UNIT capsule  Take 100 Units by mouth daily.       Outstanding Labs/Studies  None  Duration of Discharge Encounter   Greater than 30 minutes including physician time.  Signed, Nicolasa Ducking NP 07/30/2013, 9:34 AM

## 2013-07-30 NOTE — Discharge Instructions (Signed)
***  PLEASE REMEMBER TO BRING ALL OF YOUR MEDICATIONS TO EACH OF YOUR FOLLOW-UP OFFICE VISITS.  

## 2013-07-31 ENCOUNTER — Encounter (HOSPITAL_COMMUNITY): Payer: Self-pay | Admitting: Emergency Medicine

## 2013-07-31 ENCOUNTER — Inpatient Hospital Stay (HOSPITAL_COMMUNITY)
Admission: EM | Admit: 2013-07-31 | Discharge: 2013-08-02 | DRG: 684 | Disposition: A | Discharge status: Nursing Home (Includes ICF) | Payer: Medicare Other | Attending: Internal Medicine | Admitting: Internal Medicine

## 2013-07-31 DIAGNOSIS — I1310 Hypertensive heart and chronic kidney disease without heart failure, with stage 1 through stage 4 chronic kidney disease, or unspecified chronic kidney disease: Secondary | ICD-10-CM | POA: Diagnosis present

## 2013-07-31 DIAGNOSIS — Z96649 Presence of unspecified artificial hip joint: Secondary | ICD-10-CM

## 2013-07-31 DIAGNOSIS — N179 Acute kidney failure, unspecified: Principal | ICD-10-CM | POA: Diagnosis present

## 2013-07-31 DIAGNOSIS — E785 Hyperlipidemia, unspecified: Secondary | ICD-10-CM | POA: Diagnosis present

## 2013-07-31 DIAGNOSIS — R079 Chest pain, unspecified: Secondary | ICD-10-CM

## 2013-07-31 DIAGNOSIS — I252 Old myocardial infarction: Secondary | ICD-10-CM

## 2013-07-31 DIAGNOSIS — Z87891 Personal history of nicotine dependence: Secondary | ICD-10-CM

## 2013-07-31 DIAGNOSIS — Z7982 Long term (current) use of aspirin: Secondary | ICD-10-CM

## 2013-07-31 DIAGNOSIS — I359 Nonrheumatic aortic valve disorder, unspecified: Secondary | ICD-10-CM | POA: Diagnosis present

## 2013-07-31 DIAGNOSIS — E538 Deficiency of other specified B group vitamins: Secondary | ICD-10-CM | POA: Diagnosis present

## 2013-07-31 DIAGNOSIS — I350 Nonrheumatic aortic (valve) stenosis: Secondary | ICD-10-CM

## 2013-07-31 DIAGNOSIS — Z8612 Personal history of poliomyelitis: Secondary | ICD-10-CM

## 2013-07-31 DIAGNOSIS — N183 Chronic kidney disease, stage 3 unspecified: Secondary | ICD-10-CM | POA: Diagnosis present

## 2013-07-31 DIAGNOSIS — K219 Gastro-esophageal reflux disease without esophagitis: Secondary | ICD-10-CM | POA: Diagnosis present

## 2013-07-31 DIAGNOSIS — N19 Unspecified kidney failure: Secondary | ICD-10-CM | POA: Diagnosis present

## 2013-07-31 DIAGNOSIS — I8390 Asymptomatic varicose veins of unspecified lower extremity: Secondary | ICD-10-CM | POA: Diagnosis present

## 2013-07-31 DIAGNOSIS — I2510 Atherosclerotic heart disease of native coronary artery without angina pectoris: Secondary | ICD-10-CM | POA: Diagnosis present

## 2013-07-31 DIAGNOSIS — M129 Arthropathy, unspecified: Secondary | ICD-10-CM | POA: Diagnosis present

## 2013-07-31 DIAGNOSIS — Z96659 Presence of unspecified artificial knee joint: Secondary | ICD-10-CM

## 2013-07-31 DIAGNOSIS — I6529 Occlusion and stenosis of unspecified carotid artery: Secondary | ICD-10-CM | POA: Diagnosis present

## 2013-07-31 DIAGNOSIS — I959 Hypotension, unspecified: Secondary | ICD-10-CM | POA: Diagnosis present

## 2013-07-31 DIAGNOSIS — Z79899 Other long term (current) drug therapy: Secondary | ICD-10-CM

## 2013-07-31 DIAGNOSIS — N189 Chronic kidney disease, unspecified: Secondary | ICD-10-CM

## 2013-07-31 DIAGNOSIS — E039 Hypothyroidism, unspecified: Secondary | ICD-10-CM | POA: Diagnosis present

## 2013-07-31 DIAGNOSIS — M4800 Spinal stenosis, site unspecified: Secondary | ICD-10-CM | POA: Diagnosis present

## 2013-07-31 HISTORY — DX: Chronic kidney disease, stage 3 unspecified: N18.30

## 2013-07-31 HISTORY — DX: Chronic kidney disease, stage 3 (moderate): N18.3

## 2013-07-31 LAB — PROTIME-INR
INR: 1.04 (ref 0.00–1.49)
Prothrombin Time: 13.4 seconds (ref 11.6–15.2)

## 2013-07-31 LAB — CBC WITH DIFFERENTIAL/PLATELET
Basophils Absolute: 0 10*3/uL (ref 0.0–0.1)
Basophils Relative: 0 % (ref 0–1)
EOS ABS: 0.2 10*3/uL (ref 0.0–0.7)
EOS PCT: 2 % (ref 0–5)
HCT: 35.2 % — ABNORMAL LOW (ref 36.0–46.0)
HEMOGLOBIN: 11.9 g/dL — AB (ref 12.0–15.0)
LYMPHS ABS: 1.9 10*3/uL (ref 0.7–4.0)
Lymphocytes Relative: 20 % (ref 12–46)
MCH: 33.9 pg (ref 26.0–34.0)
MCHC: 33.8 g/dL (ref 30.0–36.0)
MCV: 100.3 fL — AB (ref 78.0–100.0)
MONOS PCT: 13 % — AB (ref 3–12)
Monocytes Absolute: 1.2 10*3/uL — ABNORMAL HIGH (ref 0.1–1.0)
NEUTROS PCT: 65 % (ref 43–77)
Neutro Abs: 6.1 10*3/uL (ref 1.7–7.7)
Platelets: 189 10*3/uL (ref 150–400)
RBC: 3.51 MIL/uL — AB (ref 3.87–5.11)
RDW: 12.9 % (ref 11.5–15.5)
WBC: 9.4 10*3/uL (ref 4.0–10.5)

## 2013-07-31 LAB — POCT I-STAT, CHEM 8
BUN: 52 mg/dL — ABNORMAL HIGH (ref 6–23)
CHLORIDE: 100 meq/L (ref 96–112)
Calcium, Ion: 1.18 mmol/L (ref 1.13–1.30)
Creatinine, Ser: 2.4 mg/dL — ABNORMAL HIGH (ref 0.50–1.10)
Glucose, Bld: 103 mg/dL — ABNORMAL HIGH (ref 70–99)
HCT: 37 % (ref 36.0–46.0)
Hemoglobin: 12.6 g/dL (ref 12.0–15.0)
Potassium: 4.6 mEq/L (ref 3.7–5.3)
Sodium: 136 mEq/L — ABNORMAL LOW (ref 137–147)
TCO2: 24 mmol/L (ref 0–100)

## 2013-07-31 LAB — APTT: aPTT: 33 seconds (ref 24–37)

## 2013-07-31 LAB — D-DIMER, QUANTITATIVE: D-Dimer, Quant: 0.86 ug/mL-FEU — ABNORMAL HIGH (ref 0.00–0.48)

## 2013-07-31 LAB — PRO B NATRIURETIC PEPTIDE: Pro B Natriuretic peptide (BNP): 3027 pg/mL — ABNORMAL HIGH (ref 0–450)

## 2013-07-31 MED ORDER — FENTANYL CITRATE 0.05 MG/ML IJ SOLN
50.0000 ug | Freq: Once | INTRAMUSCULAR | Status: DC
  Filled 2013-07-31: qty 2

## 2013-07-31 MED ORDER — HEPARIN (PORCINE) IN NACL 100-0.45 UNIT/ML-% IJ SOLN
850.0000 [IU]/h | INTRAMUSCULAR | Status: DC
  Administered 2013-07-31: 850 [IU]/h via INTRAVENOUS
  Filled 2013-07-31: qty 250

## 2013-07-31 MED ORDER — SODIUM CHLORIDE 0.9 % IV BOLUS (SEPSIS)
250.0000 mL | Freq: Once | INTRAVENOUS | Status: AC
  Administered 2013-07-31: 250 mL via INTRAVENOUS

## 2013-07-31 MED ORDER — HEPARIN BOLUS VIA INFUSION
3000.0000 [IU] | Freq: Once | INTRAVENOUS | Status: AC
  Administered 2013-07-31: 3000 [IU] via INTRAVENOUS
  Filled 2013-07-31: qty 3000

## 2013-07-31 NOTE — ED Notes (Signed)
[facility] Pt was d/c from the hospital yesterday after have increase fluid from CHF. Pt had an episode of chest pressure that radiated down left arm and to back with some SOB. Staff on scene helped pt self administered Nitro x 1 and pressure at the time was 100 systolic. Pt now has a BP 85 systolic. EMS unable to get an accurate O2sat. Pt currently at 99% ra. Pt does have some anxiety per EMS.

## 2013-07-31 NOTE — Progress Notes (Signed)
ANTICOAGULATION CONSULT NOTE - Initial Consult  Pharmacy Consult for heparin Indication: chest pain/ACS  Allergies  Allergen Reactions  . Codeine Anaphylaxis, Nausea And Vomiting and Rash    Patient Measurements:   Heparin Dosing Weight: 60kg  Vital Signs: Temp: 98.2 F (36.8 C) (01/02 2035) Temp src: Oral (01/02 2035) BP: 94/48 mmHg (01/02 2045) Pulse Rate: 96 (01/02 2045)  Labs:  Recent Labs  07/29/13 0125 07/29/13 0610 07/30/13 0503 07/31/13 1924 07/31/13 1949  HGB 10.8*  --   --  11.9* 12.6  HCT 32.7*  --   --  35.2* 37.0  PLT 164  --   --  189  --   CREATININE 1.04  1.04  --  1.08  --  2.40*  TROPONINI 1.99* 2.05*  --   --   --     The CrCl is unknown because both a height and weight (above a minimum accepted value) are required for this calculation.   Medical History: Past Medical History  Diagnosis Date  . Essential hypertension   . CAD (coronary artery disease) 2005    a. NSTEMI 06/2011, cath showed chronically occluded RCA with good collaterals from the left system and no obstructive disease in the LAD the circumflex, nl LV fxn;  b. 06/2013 elevated troponin in setting of chf, EF 55% w/ apical HK-->Med Rx.  . Severe aortic stenosis     a. 06/2013 Echo: EF 55%,  Apical HK, mod LVH, Gr 1 DD, Sev AS (mean 38/peak 64), mild MR, mod dil LA/RA, PASP 32.  Marland Kitchen. Spinal stenosis 2008  . Polio 1931    "made my left arm weak since" (05/01/2012)  . Hypothyroidism   . Pneumonia 1923; 1959; 1981; 2011  . B12 deficiency anemia   . Carotid artery stenosis 2005    right  . High cholesterol   . Varicose veins     BLE "since the 1940's" (05/01/2012)  . Chronic bronchitis     "~ q winter" (05/01/2012)  . GERD (gastroesophageal reflux disease)   . Arthritis     "all over my body" (05/01/2012)    Medications:   (Not in a hospital admission) Scheduled:  . fentaNYL  50 mcg Intravenous Once    Assessment: 80 yo who is seen today in the ED for CP. Pt with hx CAD.  Baseline CBC looks ok. Scr is elevated at 2.4. Will start IV heparin to r/o MI.   Goal of Therapy:  Heparin level 0.3-0.7 units/ml Monitor platelets by anticoagulation protocol: Yes   Plan:   Heparin bolus 3000 units x1 Heparin drip at 850 units/hr Check heparin level in AM Daily HL and CBC  Ulyses SouthwardMinh Shailee Foots, PharmD Pager: [phone] 07/31/2013 9:43 PM

## 2013-07-31 NOTE — ED Notes (Signed)
Jacubowitz, MD at bedside.  

## 2013-07-31 NOTE — ED Provider Notes (Signed)
CSN: [id number]     Arrival date & time 07/31/13  1832 History   First MD Initiated Contact with Patient 07/31/13 1858     Chief Complaint  Patient presents with  . Chest Pain   (Consider location/radiation/quality/duration/timing/severity/associated sxs/prior Treatment) Patient is a 80 y.o. female presenting with chest pain.  Chest Pain Associated symptoms: shortness of breath    Complains of anterior chest pain onset this afternoon, pleuritic and pressure-like complete by mild shortness of breath. She's treated by EMS with one sublingual nitroglycerin with transient relief however nitroglycerin caused . blood pressure to dropp to 85 systolic transiently. Pain is anterior, nonradiating. Felt like "heart attack "she's had in the past. Patient was discharged from here yesterday patient also reports diarrhea today.1 episode. No other associated symptoms. Pain transiently alleviated with nitroglycerin, nothing makes symptoms worse. Past Medical History  Diagnosis Date  . Essential hypertension   . CAD (coronary artery disease) 2005    a. NSTEMI 06/2011, cath showed chronically occluded RCA with good collaterals from the left system and no obstructive disease in the LAD the circumflex, nl LV fxn;  b. 06/2013 elevated troponin in setting of chf, EF 55% w/ apical HK-->Med Rx.  . Severe aortic stenosis     a. 06/2013 Echo: EF 55%,  Apical HK, mod LVH, Gr 1 DD, Sev AS (mean 38/peak 64), mild MR, mod dil LA/RA, PASP 32.  Marland Kitchen Spinal stenosis 2008  . Polio 1931    "made my left arm weak since" (05/01/2012)  . Hypothyroidism   . Pneumonia 1923; 1959; 1981; 2011  . B12 deficiency anemia   . Carotid artery stenosis 2005    right  . High cholesterol   . Varicose veins     BLE "since the 1940's" (05/01/2012)  . Chronic bronchitis     "~ q winter" (05/01/2012)  . GERD (gastroesophageal reflux disease)   . Arthritis     "all over my body" (05/01/2012)   Past Surgical History  Procedure Laterality Date   . Joint replacement    . Shoulder debridement    . Total knee arthroplasty  2000  . Thyroidectomy    . Bunionectomy  2004    "and big toe repair; right" (05/01/2012)  . Belpharoptosis repair  10/2007    bilaterally  . Tonsillectomy  1932  . Appendectomy  1940  . Ganglion cyst excision  1955    right hand  . Dilation and curettage of uterus  1969; 1974  . Cholecystectomy  1983  . Knee arthroscopy  1990    left  . Colonoscopy  1988; 1989; 1994; 1999    "all clear"  . Cataract extraction, bilateral  [id number]  . Total hip arthroplasty  1999    right  . Cardiac catheterization  2006?   Family History  Problem Relation Age of Onset  . Coronary artery disease Father   . Coronary artery disease Brother   . Coronary artery disease Brother    History  Substance Use Topics  . Smoking status: Former Smoker -- 0.50 packs/day for 22 years    Types: Cigarettes    Quit date: 07/30/1962  . Smokeless tobacco: Never Used  . Alcohol Use: 8.4 oz/week    7 Glasses of wine, 7 Shots of liquor per week     Comment: 05/01/2012 "2 drinks/day; wine or liquor" 07/31/13 "occasionally"   OB History   Grav Para Term Preterm Abortions TAB SAB Ect Mult Living  Review of Systems  Respiratory: Positive for shortness of breath.   Cardiovascular: Positive for chest pain.  Gastrointestinal: Positive for diarrhea.  All other systems reviewed and are negative.    Allergies  Codeine  Home Medications   Current Outpatient Rx  Name  Route  Sig  Dispense  Refill  . acetaminophen (TYLENOL ARTHRITIS PAIN) 650 MG CR tablet   Oral   Take 1,300 mg by mouth 2 (two) times daily. For pain         . albuterol (PROVENTIL HFA;VENTOLIN HFA) 108 (90 BASE) MCG/ACT inhaler   Inhalation   Inhale 2 puffs into the lungs daily as needed for wheezing or shortness of breath.          Marland Kitchen aspirin EC 81 MG tablet   Oral   Take 1 tablet (81 mg total) by mouth daily.   90 tablet   3   . B Complex-C  (B-COMPLEX WITH VITAMIN C) tablet   Oral   Take 1 tablet by mouth daily.           . benazepril (LOTENSIN) 10 MG tablet   Oral   Take 10 mg by mouth daily.          . cholecalciferol (VITAMIN D) 1000 UNITS tablet   Oral   Take 1,000 Units by mouth daily.           . famotidine (PEPCID) 20 MG tablet   Oral   Take 20 mg by mouth 2 (two) times daily.         . fexofenadine (ALLEGRA) 60 MG tablet   Oral   Take 60 mg by mouth 2 (two) times daily.         . furosemide (LASIX) 20 MG tablet   Oral   Take 30 mg by mouth 2 (two) times daily.          . Glucosamine-Chondroitin (OSTEO BI-FLEX REGULAR STRENGTH PO)   Oral   Take 1 capsule by mouth daily.         . Lecithin 400 MG CAPS   Oral   Take 1 capsule by mouth 2 (two) times daily.         Marland Kitchen levothyroxine (SYNTHROID, LEVOTHROID) 88 MCG tablet   Oral   Take 88 mcg by mouth daily.           Marland Kitchen lidocaine (LIDODERM) 5 %   Transdermal   Place 1 patch onto the skin daily as needed (pain). Remove & Discard patch within 12 hours or as directed by MD         . Melatonin 3 MG TABS   Oral   Take 3 mg by mouth at bedtime.          . Menthol, Topical Analgesic, (ARCTIC RELIEF EX)   Apply externally   Apply 1 spray topically every 3 (three) hours as needed (pain).         . metoprolol (TOPROL-XL) 50 MG 24 hr tablet   Oral   Take 50 mg by mouth daily.          . Multiple Vitamin (MULTIVITAMIN WITH MINERALS) TABS tablet   Oral   Take 1 tablet by mouth daily with lunch.         . nitroGLYCERIN (NITROSTAT) 0.4 MG SL tablet   Sublingual   Place 0.4 mg under the tongue every 5 (five) minutes as needed for chest pain.         . polyethylene glycol (MIRALAX /  GLYCOLAX) packet   Oral   Take 17 g by mouth daily as needed (constipation).          . potassium chloride (K-DUR) 10 MEQ tablet   Oral   Take 20 mEq by mouth 2 (two) times daily. Take with Lasix (furosemide)         . pravastatin  (PRAVACHOL) 40 MG tablet   Oral   Take 40 mg by mouth at bedtime.         . psyllium (METAMUCIL) 58.6 % powder   Oral   Take 1 packet by mouth daily.          . vitamin B-12 (CYANOCOBALAMIN) 1000 MCG tablet   Oral   Take 1,000 mcg by mouth daily.           . vitamin E 100 UNIT capsule   Oral   Take 100 Units by mouth daily.            BP 94/49  Pulse 46  Resp 18  SpO2 100% Physical Exam  Nursing note and vitals reviewed. Constitutional: She appears well-developed and well-nourished. No distress.  HENT:  Head: Normocephalic and atraumatic.  Eyes: Conjunctivae are normal. Pupils are equal, round, and reactive to light.  Neck: Neck supple. No tracheal deviation present. No thyromegaly present.  Cardiovascular: Normal rate and regular rhythm.   Murmur heard. Grade 2/6 systolic ejection murmur  Pulmonary/Chest: Effort normal and breath sounds normal.  Abdominal: Soft. Bowel sounds are normal. She exhibits no distension. There is no tenderness.  Musculoskeletal: Normal range of motion. She exhibits no edema and no tenderness.  Neurological: She is alert. Coordination normal.  Skin: Skin is warm and dry. No rash noted.  Psychiatric: She has a normal mood and affect.    ED Course  Procedures (including critical care time) Labs Review Labs Reviewed  D-DIMER, QUANTITATIVE  CBC WITH DIFFERENTIAL  PRO B NATRIURETIC PEPTIDE   Imaging Review No results found.  EKG Interpretation    Date/Time:  Friday July 31 2013 18:40:29 EST Ventricular Rate:  75 PR Interval:  182 QRS Duration: 101 QT Interval:  394 QTC Calculation: 440 R Axis:   25 Text Interpretation:  Sinus rhythm Supraventricular bigeminy Minimal ST depression, lateral leads No significant change since last tracing Confirmed by Ethelda ChickJACUBOWITZ  MD, Kal Chait ([id number]) on 07/31/2013 7:50:45 PM          chest xtray viewed  By me Results for orders placed during the hospital encounter of 07/31/13  D-DIMER,  QUANTITATIVE      Result Value Range   D-Dimer, Quant 0.86 (*) 0.00 - 0.48 ug/mL-FEU  CBC WITH DIFFERENTIAL      Result Value Range   WBC 9.4  4.0 - 10.5 K/uL   RBC 3.51 (*) 3.87 - 5.11 MIL/uL   Hemoglobin 11.9 (*) 12.0 - 15.0 g/dL   HCT 78.235.2 (*) 95.636.0 - 21.346.0 %   MCV 100.3 (*) 78.0 - 100.0 fL   MCH 33.9  26.0 - 34.0 pg   MCHC 33.8  30.0 - 36.0 g/dL   RDW 08.612.9  57.811.5 - 46.915.5 %   Platelets 189  150 - 400 K/uL   Neutrophils Relative % 65  43 - 77 %   Neutro Abs 6.1  1.7 - 7.7 K/uL   Lymphocytes Relative 20  12 - 46 %   Lymphs Abs 1.9  0.7 - 4.0 K/uL   Monocytes Relative 13 (*) 3 - 12 %   Monocytes Absolute 1.2 (*)  0.1 - 1.0 K/uL   Eosinophils Relative 2  0 - 5 %   Eosinophils Absolute 0.2  0.0 - 0.7 K/uL   Basophils Relative 0  0 - 1 %   Basophils Absolute 0.0  0.0 - 0.1 K/uL  PRO B NATRIURETIC PEPTIDE      Result Value Range   Pro B Natriuretic peptide (BNP) 3027.0 (*) 0 - 450 pg/mL  POCT I-STAT, CHEM 8      Result Value Range   Sodium 136 (*) 137 - 147 mEq/L   Potassium 4.6  3.7 - 5.3 mEq/L   Chloride 100  96 - 112 mEq/L   BUN 52 (*) 6 - 23 mg/dL   Creatinine, Ser 1.61 (*) 0.50 - 1.10 mg/dL   Glucose, Bld 096 (*) 70 - 99 mg/dL   Calcium, Ion 0.45  4.09 - 1.30 mmol/L   TCO2 24  0 - 100 mmol/L   Hemoglobin 12.6  12.0 - 15.0 g/dL   HCT 81.1  91.4 - 78.2 %   Dg Chest Port 1 View  07/28/2013   CLINICAL DATA:  Shortness of breath.  Congestive heart failure.  EXAM: PORTABLE CHEST - 1 VIEW  COMPARISON:  04/24/2013  FINDINGS: Shallow inspiration. Mild cardiac enlargement. Pulmonary vascularity appears normal. Calcification in the mitral valve annulus. Calcified and tortuous aorta. No focal airspace disease or consolidation in the lungs. Slight interstitial changes in the lung bases are probably due to fibrosis. No pneumothorax. No pleural effusions. Degenerative changes in the shoulders. No significant change since prior study.  IMPRESSION: No active disease.   Electronically Signed    By: Burman Nieves M.D.   On: 07/28/2013 21:21    9:30 PM Patient reports no pain relief after treatment with intravenous fentanyl. MDM  No diagnosis found. Patient did not have further nitroglycerin and cause hypotension. Patient is not currently candidate for CT angiography   Due to renal insufficiency Spoke with [facility] cardi[facility] who will evaluate pt in ed and arraqnge for admission.iv heparin orderred Dx Chest pain DDX unstable angina vs pulmonary embolism #2 renal insufficiency #3 hypotension CRITICAL CARE Performed by: Doug Sou Total critical care time: 30 minute Critical care time was exclusive of separately billable procedures and treating other patients. Critical care was necessary to treat or prevent imminent or life-threatening deterioration. Critical care was time spent personally by me on the following activities: development of treatment plan with patient and/or surrogate as well as nursing, discussions with consultants, evaluation of patient's response to treatment, examination of patient, obtaining history from patient or surrogate, ordering and performing treatments and interventions, ordering and review of laboratory studies, ordering and review of radiographic studies, pulse oximetry and re-evaluation of patient's condition.   Doug Sou, MD 07/31/13 2137

## 2013-07-31 NOTE — ED Notes (Signed)
Pt states that her chest pain in coming back and it is "getting worse". Pt reports that the nitro she received this evening made the pain go away.

## 2013-07-31 NOTE — H&P (Signed)
History and Physical  Patient ID: Holly Gibson MRN: [id number], DOB: 06/04/1919 Date of Encounter: 07/31/2013, 11:26 PM Primary Physician: Emeterio Reeve, MD Primary Cardiologist: Hochrein  Chief Complaint: chest pain, fatigue Reason for Admission: acute renal failure, aortic stenosis, chest pain  HPI: 80yo F with CAD, HTN, severe aortic stenosis (AVA 0.75), chronic renal insufficiency who was just recently admitted to the hospital with shortness of breath with exertion and was diuresed during that hospital stay discharged one day prior who presents with an episode of diaphoresis, fatigue, had some chest pressure that was poorly described prior to admission. She was being evaluated by a nurse from home health and was given NTG at the time and presented to the ER.  Upon arrival Pt was hypotensive and has acute on chronic renal failure.  The ER was concerned about a PE and checked a D-dimer (0.86) and called for admission and initiated heparin drip.  Pt currently feels back to her baseline, but does still endorse some fatigue.  She is not currenlty having chest pain.  She denies history of fevers, chills, sweats, cough, urinary complaints, abdominal pain.  Past Medical History  Diagnosis Date  . Essential hypertension   . CAD (coronary artery disease) 2005    a. NSTEMI 06/2011, cath showed chronically occluded RCA with good collaterals from the left system and no obstructive disease in the LAD the circumflex, nl LV fxn;  b. 06/2013 elevated troponin in setting of chf, EF 55% w/ apical HK-->Med Rx.  . Severe aortic stenosis     a. 06/2013 Echo: EF 55%,  Apical HK, mod LVH, Gr 1 DD, Sev AS (mean 38/peak 64), mild MR, mod dil LA/RA, PASP 32.  Marland Kitchen Spinal stenosis 2008  . Polio 1931    "made my left arm weak since" (05/01/2012)  . Hypothyroidism   . Pneumonia 1923; 1959; 1981; 2011  . B12 deficiency anemia   . Carotid artery stenosis 2005    right  . High cholesterol   . Varicose veins     BLE  "since the 1940's" (05/01/2012)  . Chronic bronchitis     "~ q winter" (05/01/2012)  . GERD (gastroesophageal reflux disease)   . Arthritis     "all over my body" (05/01/2012)     Most Recent Cardiac Studies:    Surgical History:  Past Surgical History  Procedure Laterality Date  . Joint replacement    . Shoulder debridement    . Total knee arthroplasty  2000  . Thyroidectomy    . Bunionectomy  2004    "and big toe repair; right" (05/01/2012)  . Belpharoptosis repair  10/2007    bilaterally  . Tonsillectomy  1932  . Appendectomy  1940  . Ganglion cyst excision  1955    right hand  . Dilation and curettage of uterus  1969; 1974  . Cholecystectomy  1983  . Knee arthroscopy  1990    left  . Colonoscopy  1988; 1989; 1994; 1999    "all clear"  . Cataract extraction, bilateral  [id number]  . Total hip arthroplasty  1999    right  . Cardiac catheterization  2006?     Home Meds: Prior to Admission medications   Medication Sig Start Date End Date Taking? Authorizing Provider  acetaminophen (TYLENOL ARTHRITIS PAIN) 650 MG CR tablet Take 1,300 mg by mouth 2 (two) times daily. For pain   Yes Historical Provider, MD  albuterol (PROVENTIL HFA;VENTOLIN HFA) 108 (90 BASE) MCG/ACT inhaler Inhale 2 puffs  into the lungs daily as needed for wheezing or shortness of breath.    Yes Historical Provider, MD  aspirin EC 81 MG tablet Take 1 tablet (81 mg total) by mouth daily. 12/09/12  Yes Lewayne Bunting, MD  B Complex-C (B-COMPLEX WITH VITAMIN C) tablet Take 1 tablet by mouth daily.     Yes Historical Provider, MD  benazepril (LOTENSIN) 10 MG tablet Take 10 mg by mouth daily.    Yes Historical Provider, MD  cholecalciferol (VITAMIN D) 1000 UNITS tablet Take 1,000 Units by mouth daily.     Yes Historical Provider, MD  famotidine (PEPCID) 20 MG tablet Take 20 mg by mouth 2 (two) times daily.   Yes Historical Provider, MD  fexofenadine (ALLEGRA) 60 MG tablet Take 60 mg by mouth 2 (two) times daily.    Yes Historical Provider, MD  furosemide (LASIX) 20 MG tablet Take 30 mg by mouth 2 (two) times daily.    Yes Historical Provider, MD  Glucosamine-Chondroitin (OSTEO BI-FLEX REGULAR STRENGTH PO) Take 1 capsule by mouth daily.   Yes Historical Provider, MD  Lecithin 400 MG CAPS Take 1 capsule by mouth 2 (two) times daily.   Yes Historical Provider, MD  levothyroxine (SYNTHROID, LEVOTHROID) 88 MCG tablet Take 88 mcg by mouth daily.     Yes Historical Provider, MD  lidocaine (LIDODERM) 5 % Place 1 patch onto the skin daily as needed (pain). Remove & Discard patch within 12 hours or as directed by MD   Yes Historical Provider, MD  Melatonin 3 MG TABS Take 3 mg by mouth at bedtime.    Yes Historical Provider, MD  Menthol, Topical Analgesic, (ARCTIC RELIEF EX) Apply 1 spray topically every 3 (three) hours as needed (pain).   Yes Historical Provider, MD  metoprolol (TOPROL-XL) 50 MG 24 hr tablet Take 50 mg by mouth daily at 12 noon.    Yes Historical Provider, MD  Multiple Vitamin (MULTIVITAMIN WITH MINERALS) TABS tablet Take 1 tablet by mouth daily with lunch.   Yes Historical Provider, MD  nitroGLYCERIN (NITROSTAT) 0.4 MG SL tablet Place 0.4 mg under the tongue every 5 (five) minutes as needed for chest pain.   Yes Historical Provider, MD  polyethylene glycol (MIRALAX / GLYCOLAX) packet Take 17 g by mouth daily as needed (constipation).    Yes Historical Provider, MD  potassium chloride (K-DUR) 10 MEQ tablet Take 20 mEq by mouth 2 (two) times daily. Take with Lasix (furosemide)   Yes Historical Provider, MD  pravastatin (PRAVACHOL) 40 MG tablet Take 40 mg by mouth at bedtime.   Yes Historical Provider, MD  psyllium (METAMUCIL) 58.6 % powder Take 1 packet by mouth daily.    Yes Historical Provider, MD  vitamin B-12 (CYANOCOBALAMIN) 1000 MCG tablet Take 1,000 mcg by mouth daily.     Yes Historical Provider, MD  vitamin E 100 UNIT capsule Take 100 Units by mouth daily.     Yes Historical Provider, MD     Allergies:  Allergies  Allergen Reactions  . Codeine Anaphylaxis, Nausea And Vomiting and Rash    History   Social History  . Marital Status: Widowed    Spouse Name: N/A    Number of Children: N/A  . Years of Education: N/A   Occupational History  . Not on file.   Social History Main Topics  . Smoking status: Former Smoker -- 0.50 packs/day for 22 years    Types: Cigarettes    Quit date: 07/30/1962  . Smokeless tobacco: Never  Used  . Alcohol Use: 8.4 oz/week    7 Glasses of wine, 7 Shots of liquor per week     Comment: 05/01/2012 "2 drinks/day; wine or liquor" 07/31/13 "occasionally"  . Drug Use: No  . Sexual Activity: No   Other Topics Concern  . Not on file   Social History Narrative  . No narrative on file     Family History  Problem Relation Age of Onset  . Coronary artery disease Father   . Coronary artery disease Brother   . Coronary artery disease Brother     Review of Systems: per HPI All other systems reviewed and are otherwise negative except as noted above.  Labs:   Lab Results  Component Value Date   WBC 9.4 07/31/2013   HGB 12.6 07/31/2013   HCT 37.0 07/31/2013   MCV 100.3* 07/31/2013   PLT 189 07/31/2013    Recent Labs Lab 07/30/13 0503 07/31/13 1949  NA 141 136*  K 4.0 4.6  CL 101 100  CO2 24  --   BUN 34* 52*  CREATININE 1.08 2.40*  CALCIUM 9.4  --   GLUCOSE 86 103*    Recent Labs  07/29/13 0125 07/29/13 0610  TROPONINI 1.99* 2.05*   Lab Results  Component Value Date   CHOL 186 02/19/2012   HDL 76.30 02/19/2012   LDLCALC 94 02/19/2012   TRIG 78.0 02/19/2012   Lab Results  Component Value Date   DDIMER 0.86* 07/31/2013    Radiology/Studies:  Dg Chest Port 1 View  07/28/2013   CLINICAL DATA:  Shortness of breath.  Congestive heart failure.  EXAM: PORTABLE CHEST - 1 VIEW  COMPARISON:  04/24/2013  FINDINGS: Shallow inspiration. Mild cardiac enlargement. Pulmonary vascularity appears normal. Calcification in the mitral valve  annulus. Calcified and tortuous aorta. No focal airspace disease or consolidation in the lungs. Slight interstitial changes in the lung bases are probably due to fibrosis. No pneumothorax. No pleural effusions. Degenerative changes in the shoulders. No significant change since prior study.  IMPRESSION: No active disease.   Electronically Signed   By: Burman Nieves M.D.   On: 07/28/2013 21:21     EKG: sinus rhyhtm, PACs, poor R wave progression, NSST changes  Physical Exam: Blood pressure 80/46, pulse 89, temperature 98.2 F (36.8 C), temperature source Oral, resp. rate 16, SpO2 96.00%. General: frail elderly female, NAD, A and O, providing full history Head: Normocephalic, atraumatic, sclera non-icteric, no xanthomas, nares are without discharge, very dry mucus membranes  Neck: Negative for carotid bruits. JVD not elevated. Lungs: Clear bilaterally to auscultation without wheezes, rales, or rhonchi. Breathing is unlabored. Heart: RRR with S1 severely diminished to absent S2. III/VI crescendo-decrescendo SM, no S3 Abdomen: Soft, non-tender, non-distended with normoactive bowel sounds. No hepatomegaly. No rebound/guarding. No obvious abdominal masses. Msk:  Strength and tone appear normal for age. Extremities: No clubbing or cyanosis. Trace edema.  Distal pedal pulses are 2+ and equal bilaterally. Neuro: Alert and oriented X 3. No focal deficit. No facial asymmetry. Moves all extremities spontaneously. Psych:  Responds to questions appropriately with a normal affect.    ASSESSMENT AND PLAN:  80yo F with CAD, HTN, severe aortic stenosis (AVA 0.75), chronic renal insufficiency who was just recently admitted to the hospital with shortness of breath with exertion and was diuresed during that hospital stay discharged one day prior who presents with an episode of diaphoresis, fatigue, had some chest pressure now admitted with acute on chronic renal insufficiency, positive d-dimer (checked  in the ER)  on heparin.  Etiology of CRI is likely secondary to recent admission with diruesis in the setting of severe aortic stenosis and a chronic cardiorenal syndrome likely exacerbated by acute hypotension after receiving NTG prior to presentation.  Chest pain is atypical and poorly defined, however d-dimer has already been checked, is positive, and Pt has been placed on heparin drip.  1. Acute on chronic renal insufficiency: Management of Pt volume status will be an ongoing issue in the setting of her severe aortic stenosis.  She is currently dry on exam and renal failure likely secondary to overdiuresis and prerenal azotemia.   --strict I and Os, daily weights  --gentle IV hydration with continue re-evaluation  --D/C NTG, would not advice use of NTG in setting of severe AS  2. Chest pain: This is likely secondary to volume depletion in the setting of severe AS and feel PE is less likely, however, she has positive D-dimer (nonspecific, likely secondary to multiple chronic conditions) and has been initiated on heparin.  --plan for V/Q in the AM  --serial troponins  3. Severe AS: Pt is followed by Dr. Antoine PocheHochrein and will defer discussions of chronic management.  However, Pt prognosis is poor with uncorrected severe AS in setting of symptoms of chest pain and heart failure.   4. PPX: therapeutic heparin  Signed, Marwa Fuhrman PA-C 07/31/2013, 11:26 PM

## 2013-08-01 ENCOUNTER — Observation Stay (HOSPITAL_COMMUNITY): Payer: Medicare Other

## 2013-08-01 DIAGNOSIS — I359 Nonrheumatic aortic valve disorder, unspecified: Secondary | ICD-10-CM

## 2013-08-01 DIAGNOSIS — N19 Unspecified kidney failure: Secondary | ICD-10-CM

## 2013-08-01 LAB — CBC
HEMATOCRIT: 32.3 % — AB (ref 36.0–46.0)
Hemoglobin: 10.9 g/dL — ABNORMAL LOW (ref 12.0–15.0)
MCH: 33.9 pg (ref 26.0–34.0)
MCHC: 33.7 g/dL (ref 30.0–36.0)
MCV: 100.3 fL — ABNORMAL HIGH (ref 78.0–100.0)
Platelets: 180 10*3/uL (ref 150–400)
RBC: 3.22 MIL/uL — ABNORMAL LOW (ref 3.87–5.11)
RDW: 13 % (ref 11.5–15.5)
WBC: 9 10*3/uL (ref 4.0–10.5)

## 2013-08-01 LAB — LIPID PANEL
Cholesterol: 124 mg/dL (ref 0–200)
HDL: 54 mg/dL (ref >39)
LDL Cholesterol: 59 mg/dL (ref 0–99)
Total CHOL/HDL Ratio: 2.3 RATIO
Triglycerides: 56 mg/dL (ref <150)
VLDL: 11 mg/dL (ref 0–40)

## 2013-08-01 LAB — PROTIME-INR
INR: 1.01 (ref 0.00–1.49)
Prothrombin Time: 13.1 seconds (ref 11.6–15.2)

## 2013-08-01 LAB — BASIC METABOLIC PANEL
BUN: 50 mg/dL — AB (ref 6–23)
CHLORIDE: 103 meq/L (ref 96–112)
CO2: 16 mEq/L — ABNORMAL LOW (ref 19–32)
Calcium: 8.7 mg/dL (ref 8.4–10.5)
Creatinine, Ser: 1.62 mg/dL — ABNORMAL HIGH (ref 0.50–1.10)
GFR calc non Af Amer: 26 mL/min — ABNORMAL LOW (ref >90)
GFR, EST AFRICAN AMERICAN: 31 mL/min — AB (ref >90)
Glucose, Bld: 87 mg/dL (ref 70–99)
POTASSIUM: 4.6 meq/L (ref 3.7–5.3)
Sodium: 137 mEq/L (ref 137–147)

## 2013-08-01 LAB — TROPONIN I
TROPONIN I: 4.03 ng/mL — AB (ref <0.30)
Troponin I: 1.53 ng/mL (ref <0.30)

## 2013-08-01 LAB — MRSA PCR SCREENING: MRSA BY PCR: NEGATIVE

## 2013-08-01 LAB — HEPARIN LEVEL (UNFRACTIONATED): HEPARIN UNFRACTIONATED: 0.33 [IU]/mL (ref 0.30–0.70)

## 2013-08-01 MED ORDER — TECHNETIUM TO 99M ALBUMIN AGGREGATED
6.0000 | Freq: Once | INTRAVENOUS | Status: AC | PRN
  Administered 2013-08-01: 6 via INTRAVENOUS

## 2013-08-01 MED ORDER — HEPARIN SODIUM (PORCINE) 5000 UNIT/ML IJ SOLN
5000.0000 [IU] | Freq: Three times a day (TID) | INTRAMUSCULAR | Status: DC
  Administered 2013-08-01 – 2013-08-02 (×2): 5000 [IU] via SUBCUTANEOUS
  Filled 2013-08-01 (×6): qty 1

## 2013-08-01 MED ORDER — FAMOTIDINE 20 MG PO TABS
20.0000 mg | ORAL_TABLET | Freq: Two times a day (BID) | ORAL | Status: DC
  Administered 2013-08-01 – 2013-08-02 (×4): 20 mg via ORAL
  Filled 2013-08-01 (×5): qty 1

## 2013-08-01 MED ORDER — ONDANSETRON HCL 4 MG/2ML IJ SOLN: 4.0000 mg | Freq: Four times a day (QID) | INTRAMUSCULAR | Status: DC | PRN

## 2013-08-01 MED ORDER — METOPROLOL SUCCINATE ER 50 MG PO TB24
50.0000 mg | ORAL_TABLET | Freq: Every day | ORAL | Status: DC
  Filled 2013-08-01 (×2): qty 1

## 2013-08-01 MED ORDER — POLYETHYLENE GLYCOL 3350 17 G PO PACK
17.0000 g | PACK | Freq: Every day | ORAL | Status: DC | PRN
  Filled 2013-08-01: qty 1

## 2013-08-01 MED ORDER — TECHNETIUM TC 99M DIETHYLENETRIAME-PENTAACETIC ACID: 40.0000 | Freq: Once | INTRAVENOUS | Status: AC | PRN

## 2013-08-01 MED ORDER — LEVOTHYROXINE SODIUM 88 MCG PO TABS
88.0000 ug | ORAL_TABLET | Freq: Every day | ORAL | Status: DC
  Administered 2013-08-01 – 2013-08-02 (×2): 88 ug via ORAL
  Filled 2013-08-01 (×3): qty 1

## 2013-08-01 MED ORDER — ACETAMINOPHEN 325 MG PO TABS: 650.0000 mg | ORAL_TABLET | ORAL | Status: DC | PRN

## 2013-08-01 MED ORDER — ASPIRIN EC 81 MG PO TBEC
81.0000 mg | DELAYED_RELEASE_TABLET | Freq: Every day | ORAL | Status: DC
  Administered 2013-08-01 – 2013-08-02 (×2): 81 mg via ORAL
  Filled 2013-08-01 (×2): qty 1

## 2013-08-01 MED ORDER — SODIUM CHLORIDE 0.9 % IV SOLN
INTRAVENOUS | Status: AC
  Administered 2013-08-01: 02:00:00 via INTRAVENOUS

## 2013-08-01 NOTE — Progress Notes (Signed)
SUBJECTIVE:  No chest pain.  No SOB.    PHYSICAL EXAM Filed Vitals:   07/31/13 2245 07/31/13 2345 08/01/13 0052 08/01/13 0500  BP: 80/46 85/53 92/53  83/54  Pulse: 89 66 72 84  Temp:   97.2 F (36.2 C) 98.3 F (36.8 C)  TempSrc:   Oral Oral  Resp: 16 22 18 18   Height:   5' (1.524 m)   Weight:   159 lb 6.3 oz (72.3 kg)   SpO2: 96% 98% 98% 98%   General:  No distress Lungs:  Clear Heart:  RRR Abdomen:  Positive bowel sounds, no rebound no guarding Extremities:  Trace edema  LABS: Lab Results  Component Value Date   TROPONINI 1.53* 08/01/2013   Results for orders placed during the hospital encounter of 07/31/13 (from the past 24 hour(s))  D-DIMER, QUANTITATIVE     Status: Abnormal   Collection Time    07/31/13  7:24 PM      Result Value Range   D-Dimer, Quant 0.86 (*) 0.00 - 0.48 ug/mL-FEU  CBC WITH DIFFERENTIAL     Status: Abnormal   Collection Time    07/31/13  7:24 PM      Result Value Range   WBC 9.4  4.0 - 10.5 K/uL   RBC 3.51 (*) 3.87 - 5.11 MIL/uL   Hemoglobin 11.9 (*) 12.0 - 15.0 g/dL   HCT 16.135.2 (*) 09.636.0 - 04.546.0 %   MCV 100.3 (*) 78.0 - 100.0 fL   MCH 33.9  26.0 - 34.0 pg   MCHC 33.8  30.0 - 36.0 g/dL   RDW 40.912.9  81.111.5 - 91.415.5 %   Platelets 189  150 - 400 K/uL   Neutrophils Relative % 65  43 - 77 %   Neutro Abs 6.1  1.7 - 7.7 K/uL   Lymphocytes Relative 20  12 - 46 %   Lymphs Abs 1.9  0.7 - 4.0 K/uL   Monocytes Relative 13 (*) 3 - 12 %   Monocytes Absolute 1.2 (*) 0.1 - 1.0 K/uL   Eosinophils Relative 2  0 - 5 %   Eosinophils Absolute 0.2  0.0 - 0.7 K/uL   Basophils Relative 0  0 - 1 %   Basophils Absolute 0.0  0.0 - 0.1 K/uL  PRO B NATRIURETIC PEPTIDE     Status: Abnormal   Collection Time    07/31/13  7:25 PM      Result Value Range   Pro B Natriuretic peptide (BNP) 3027.0 (*) 0 - 450 pg/mL  POCT I-STAT, CHEM 8     Status: Abnormal   Collection Time    07/31/13  7:49 PM      Result Value Range   Sodium 136 (*) 137 - 147 mEq/L   Potassium 4.6   3.7 - 5.3 mEq/L   Chloride 100  96 - 112 mEq/L   BUN 52 (*) 6 - 23 mg/dL   Creatinine, Ser 7.822.40 (*) 0.50 - 1.10 mg/dL   Glucose, Bld 956103 (*) 70 - 99 mg/dL   Calcium, Ion 2.131.18  0.861.13 - 1.30 mmol/L   TCO2 24  0 - 100 mmol/L   Hemoglobin 12.6  12.0 - 15.0 g/dL   HCT 57.837.0  46.936.0 - 62.946.0 %  PROTIME-INR     Status: None   Collection Time    07/31/13 10:00 PM      Result Value Range   Prothrombin Time 13.4  11.6 - 15.2 seconds   INR 1.04  0.00 -  1.49  APTT     Status: None   Collection Time    07/31/13 10:00 PM      Result Value Range   aPTT 33  24 - 37 seconds  MRSA PCR SCREENING     Status: None   Collection Time    08/01/13 12:56 AM      Result Value Range   MRSA by PCR NEGATIVE  NEGATIVE  TROPONIN I     Status: Abnormal   Collection Time    08/01/13  2:50 AM      Result Value Range   Troponin I 1.53 (*) <0.30 ng/mL  LIPID PANEL     Status: None   Collection Time    08/01/13  2:50 AM      Result Value Range   Cholesterol 124  0 - 200 mg/dL   Triglycerides 56  <161 mg/dL   HDL 54  >09 mg/dL   Total CHOL/HDL Ratio 2.3     VLDL 11  0 - 40 mg/dL   LDL Cholesterol 59  0 - 99 mg/dL  CBC     Status: Abnormal   Collection Time    08/01/13  2:50 AM      Result Value Range   WBC 9.0  4.0 - 10.5 K/uL   RBC 3.22 (*) 3.87 - 5.11 MIL/uL   Hemoglobin 10.9 (*) 12.0 - 15.0 g/dL   HCT 60.4 (*) 54.0 - 98.1 %   MCV 100.3 (*) 78.0 - 100.0 fL   MCH 33.9  26.0 - 34.0 pg   MCHC 33.7  30.0 - 36.0 g/dL   RDW 19.1  47.8 - 29.5 %   Platelets 180  150 - 400 K/uL  BASIC METABOLIC PANEL     Status: Abnormal   Collection Time    08/01/13  2:50 AM      Result Value Range   Sodium 137  137 - 147 mEq/L   Potassium 4.6  3.7 - 5.3 mEq/L   Chloride 103  96 - 112 mEq/L   CO2 16 (*) 19 - 32 mEq/L   Glucose, Bld 87  70 - 99 mg/dL   BUN 50 (*) 6 - 23 mg/dL   Creatinine, Ser 6.21 (*) 0.50 - 1.10 mg/dL   Calcium 8.7  8.4 - 30.8 mg/dL   GFR calc non Af Amer 26 (*) >90 mL/min   GFR calc Af Amer 31 (*)  >90 mL/min  PROTIME-INR     Status: None   Collection Time    08/01/13  2:50 AM      Result Value Range   Prothrombin Time 13.1  11.6 - 15.2 seconds   INR 1.01  0.00 - 1.49    Intake/Output Summary (Last 24 hours) at 08/01/13 0831 Last data filed at 08/01/13 [id number]  Gross per 24 hour  Intake 353.75 ml  Output    500 ml  Net -146.25 ml    ASSESSMENT AND PLAN:  ACUTE RENAL INSUFFICIENCY:  Creat is improved.  Continue to hold diuresis and ACE inhibitor today.   AORTIC STENOSIS:  Long term plan per Dr. Jens Som. She does not want any invasive procedures.    CAD:  Continue medical management as was the previous plan.    CHEST PAIN:  VQ is pending.   Rollene Rotunda 08/01/2013 8:31 AM

## 2013-08-01 NOTE — Progress Notes (Signed)
Utilization Review completed.  

## 2013-08-02 ENCOUNTER — Encounter (HOSPITAL_COMMUNITY): Payer: Self-pay | Admitting: Physician Assistant

## 2013-08-02 DIAGNOSIS — R079 Chest pain, unspecified: Secondary | ICD-10-CM

## 2013-08-02 LAB — BASIC METABOLIC PANEL
BUN: 33 mg/dL — ABNORMAL HIGH (ref 6–23)
CALCIUM: 8.9 mg/dL (ref 8.4–10.5)
CO2: 21 mEq/L (ref 19–32)
CREATININE: 0.98 mg/dL (ref 0.50–1.10)
Chloride: 109 mEq/L (ref 96–112)
GFR, EST AFRICAN AMERICAN: 56 mL/min — AB (ref >90)
GFR, EST NON AFRICAN AMERICAN: 49 mL/min — AB (ref >90)
Glucose, Bld: 81 mg/dL (ref 70–99)
Potassium: 4.5 mEq/L (ref 3.7–5.3)
Sodium: 140 mEq/L (ref 137–147)

## 2013-08-02 MED ORDER — FUROSEMIDE 20 MG PO TABS: 20.0000 mg | ORAL_TABLET | Freq: Every day | ORAL | Status: DC

## 2013-08-02 NOTE — Progress Notes (Signed)
   SUBJECTIVE:  No chest pain.  No SOB.  She really wants to go home.    PHYSICAL EXAM Filed Vitals:   08/01/13 0500 08/01/13 1501 08/01/13 2021 08/02/13 0452  BP: 83/54 86/39 87/60  93/51  Pulse: 84 67 82 80  Temp: 98.3 F (36.8 C) 97.9 F (36.6 C) 97.6 F (36.4 C) 98.2 F (36.8 C)  TempSrc: Oral Oral Oral Oral  Resp: 18 18 18 18   Height:      Weight:    154 lb 9.6 oz (70.126 kg)  SpO2: 98% 100% 99% 98%   General:  No distress Lungs:  Clear Heart:  RRR Abdomen:  Positive bowel sounds, no rebound no guarding Extremities:  No edema  LABS: Lab Results  Component Value Date   TROPONINI 4.03* 08/01/2013   Results for orders placed during the hospital encounter of 07/31/13 (from the past 24 hour(s))  HEPARIN LEVEL (UNFRACTIONATED)     Status: None   Collection Time    08/01/13 10:30 AM      Result Value Range   Heparin Unfractionated 0.33  0.30 - 0.70 IU/mL  TROPONIN I     Status: Abnormal   Collection Time    08/01/13 10:30 AM      Result Value Range   Troponin I 4.03 (*) <0.30 ng/mL  BASIC METABOLIC PANEL     Status: Abnormal   Collection Time    08/02/13  5:00 AM      Result Value Range   Sodium 140  137 - 147 mEq/L   Potassium 4.5  3.7 - 5.3 mEq/L   Chloride 109  96 - 112 mEq/L   CO2 21  19 - 32 mEq/L   Glucose, Bld 81  70 - 99 mg/dL   BUN 33 (*) 6 - 23 mg/dL   Creatinine, Ser 1.610.98  0.50 - 1.10 mg/dL   Calcium 8.9  8.4 - 09.610.5 mg/dL   GFR calc non Af Amer 49 (*) >90 mL/min   GFR calc Af Amer 56 (*) >90 mL/min    Intake/Output Summary (Last 24 hours) at 08/02/13 0840 Last data filed at 08/02/13 0453  Gross per 24 hour  Intake    360 ml  Output    575 ml  Net   -215 ml    ASSESSMENT AND PLAN:  ACUTE RENAL INSUFFICIENCY:  Creat is improved. I would continue to hold ACE inhibitor at discharge.  However, I think that she will need some Lasix and I will use 20 mg daily PO.  She drinks lots of water and I suggested 48 ounce fluid restriction.   AORTIC  STENOSIS:  Long term plan per Dr. Jens Somrenshaw. She does not want any invasive procedures.   ELEVATED TROPOININ:  She would prefer conservative therapy.   CAD:  Continue medical management as was the previous plan.    CHEST PAIN:  VQ is negative and heparin stopped.   She was very vague and currently denies having any chest pain.    Fayrene FearingJames Surgicenter [facility] Clinic[facility] 08/02/2013 8:40 AM

## 2013-08-02 NOTE — Progress Notes (Signed)
[facility] given discharge instructions granddaughter at the bedside. Questions answered given copies of instructions to both patient and granddaughter. IV and tele removed.

## 2013-08-02 NOTE — Discharge Summary (Signed)
Discharge Summary   Patient ID: Holly Gibson MRN: [id number], DOB/AGE: [birth year] 80 y.o. Admit date: 07/31/2013 D/C date:     08/02/2013  Primary Care Provider: Emeterio Reeve, MD Primary Cardiologist: Jens Som  Primary Discharge Diagnoses:  1. Acute on chronic renal insufficiency stage III 2. Hypotension 3. Severe aortic stenosis - managed conservatively - 06/2013 Echo: EF 55%, Apical HK, mod LVH, Gr 1 DD, Sev AS (mean 38/peak 64), mild MR, mod dil LA/RA, PASP 32.  4. Elevated troponin - continued medical therapy - VQ negative for PE 5. CAD - history: a. NSTEMI 06/2011, cath showed chronically occluded RCA with good collaterals from the left system and no obstructive disease in the LAD the circumflex, nl LV fxn; b. 06/2013 elevated troponin in setting of chf, EF 55% w/ apical HK-->Med Rx  Secondary Discharge Diagnoses:  1. HTN 2. Spinal stenosis 3. Polio 4. Hypothyroidism 5. B12 deficiency 6. Carotid artery stenosis right 2005 7. HL 8. Varicose veins 9. GERD 10. Arthritis  Hospital Course: Holly Gibson is a 80 y/o F with history of CAD, HTN, severe aortic stenosis (AVA 0.75), chronic renal insufficiency who presented to [facility] with diaphoresis, chest pressure and fatigue. She was recently admitted to the hospital with shortness of breath with exertion and was diuresed during that hospital stay. She was discharged one day prior to this admission. She was being evaluated by home health nurse and reported the above symptoms. She was given NTG at the time and presented to the ER. Upon arrival she was hypotensive with evidence for acute on chronic renal failure. The ER was concerned about a PE and checked a D-dimer (0.86) and called for admission and initiated heparin drip. At time of cardiology eval, she denied CP and felt back to her baseline except some fatigue. She denied fevers, chills, sweats, cough, urinary complaints, abdominal pain. CXR was nonacute. She was admitted  for further evaluation. ACEI and diuretic were held. She underwent VQ which was low probability for PE. Troponin elevated to 4. The patient elected conservative therapy. She does not want any invasive procedures done. In the face of her AS, advanced age and comorbidities, prognosis is likely poor. Her creatinine gradually improved back to baseline (admit 2.4, discharge 0.98). This exacerbation was likely cardiorenal caused by hypotension in the face of her aortic stenosis. Dr. Antoine Poche recommended to continue holding ACEI but feels she will require some Lasix and recommended 20mg  daily with 48-oz fluid restriction. She denies CP or SOB. Dr. Antoine Poche has seen and examined the patient today and feels she is stable for discharge. I have left a message on our office's scheduling voicemail requesting a follow-up TOC appointment, and our office will call the patient with this appointment.   Discharge Vitals: Blood pressure 93/46, pulse 80, temperature 98.2 F (36.8 C), temperature source Oral, resp. rate 18, height 5' (1.524 m), weight 154 lb 9.6 oz (70.126 kg), SpO2 98.00%.  Labs: Lab Results  Component Value Date   WBC 9.0 08/01/2013   HGB 10.9* 08/01/2013   HCT 32.3* 08/01/2013   MCV 100.3* 08/01/2013   PLT 180 08/01/2013     Recent Labs Lab 08/02/13 0500  NA 140  K 4.5  CL 109  CO2 21  BUN 33*  CREATININE 0.98  CALCIUM 8.9  GLUCOSE 81    Recent Labs  08/01/13 0250 08/01/13 1030  TROPONINI 1.53* 4.03*   Lab Results  Component Value Date   CHOL 124 08/01/2013   HDL 54 08/01/2013  LDLCALC 59 08/01/2013   TRIG 56 08/01/2013   Lab Results  Component Value Date   DDIMER 0.86* 07/31/2013    Diagnostic Studies/Procedures   Nm Pulmonary Perf And Vent  08/01/2013   CLINICAL DATA:  Chest pain.  Concern for pulmonary embolism.  EXAM: NUCLEAR MEDICINE VENTILATION - PERFUSION LUNG SCAN  TECHNIQUE: Ventilation images were obtained in multiple projections using inhaled aerosol technetium 99 M DTPA.  Perfusion images were obtained in multiple projections after intravenous injection of Tc-6135m MAA.  COMPARISON:  NM PULMONARY VENT & PERF dated 07/03/2011; CT ANGIO CHEST W/CM &/OR WO/CM dated 05/01/2012  RADIOPHARMACEUTICALS:  40.0 mCi Tc-6835m DTPA aerosol and 6.0 mCi Tc-8035m MAA  FINDINGS: Ventilation: There is some heterogeneity to the ventilation pattern. Several small peripheral ventilation defects are noted.  Perfusion: Several small amount perfusion defects in the superior aspect of the left upper lobe are aunchanged from comparison exam. No wedge-shaped peripheral perfusion defects to suggest acute pulmonary embolism.  IMPRESSION: 1. Very low probability for acute pulmonary embolism. 2. Small peripheral perfusion defects are unchanged from comparison V/Q scan and matched by the ventilation scan.   Electronically Signed   By: Genevive BiStewart  Edmunds M.D.   On: 08/01/2013 08:50   Dg Chest Port 1 View  07/28/2013   CLINICAL DATA:  Shortness of breath.  Congestive heart failure.  EXAM: PORTABLE CHEST - 1 VIEW  COMPARISON:  04/24/2013  FINDINGS: Shallow inspiration. Mild cardiac enlargement. Pulmonary vascularity appears normal. Calcification in the mitral valve annulus. Calcified and tortuous aorta. No focal airspace disease or consolidation in the lungs. Slight interstitial changes in the lung bases are probably due to fibrosis. No pneumothorax. No pleural effusions. Degenerative changes in the shoulders. No significant change since prior study.  IMPRESSION: No active disease.   Electronically Signed   By: Burman NievesWilliam  Stevens M.D.   On: 07/28/2013 21:21    Discharge Medications     Medication List    STOP taking these medications       benazepril 10 MG tablet  Commonly known as:  LOTENSIN     nitroGLYCERIN 0.4 MG SL tablet  Commonly known as:  NITROSTAT     potassium chloride 10 MEQ tablet  Commonly known as:  K-DUR      TAKE these medications       albuterol 108 (90 BASE) MCG/ACT inhaler  Commonly  known as:  PROVENTIL HFA;VENTOLIN HFA  Inhale 2 puffs into the lungs daily as needed for wheezing or shortness of breath.     ARCTIC RELIEF EX  Apply 1 spray topically every 3 (three) hours as needed (pain).     aspirin EC 81 MG tablet  Take 1 tablet (81 mg total) by mouth daily.     B-complex with vitamin C tablet  Take 1 tablet by mouth daily.     cholecalciferol 1000 UNITS tablet  Commonly known as:  VITAMIN D  Take 1,000 Units by mouth daily.     famotidine 20 MG tablet  Commonly known as:  PEPCID  Take 20 mg by mouth 2 (two) times daily.     fexofenadine 60 MG tablet  Commonly known as:  ALLEGRA  Take 60 mg by mouth 2 (two) times daily.     furosemide 20 MG tablet  Commonly known as:  LASIX  Take 1 tablet (20 mg total) by mouth daily.  Start taking on:  08/03/2013     Lecithin 400 MG Caps  Take 1 capsule by mouth 2 (two) times  daily.     levothyroxine 88 MCG tablet  Commonly known as:  SYNTHROID, LEVOTHROID  Take 88 mcg by mouth daily.     lidocaine 5 %  Commonly known as:  LIDODERM  Place 1 patch onto the skin daily as needed (pain). Remove & Discard patch within 12 hours or as directed by MD     Melatonin 3 MG Tabs  Take 3 mg by mouth at bedtime.     metoprolol succinate 50 MG 24 hr tablet  Commonly known as:  TOPROL-XL  Take 50 mg by mouth daily at 12 noon.     multivitamin with minerals Tabs tablet  Take 1 tablet by mouth daily with lunch.     OSTEO BI-FLEX REGULAR STRENGTH PO  Take 1 capsule by mouth daily.     polyethylene glycol packet  Commonly known as:  MIRALAX / GLYCOLAX  Take 17 g by mouth daily as needed (constipation).     pravastatin 40 MG tablet  Commonly known as:  PRAVACHOL  Take 40 mg by mouth at bedtime.     psyllium 58.6 % powder  Commonly known as:  METAMUCIL  Take 1 packet by mouth daily.     TYLENOL ARTHRITIS PAIN 650 MG CR tablet  Generic drug:  acetaminophen  Take 1,300 mg by mouth 2 (two) times daily. For pain      vitamin B-12 1000 MCG tablet  Commonly known as:  CYANOCOBALAMIN  Take 1,000 mcg by mouth daily.     vitamin E 100 UNIT capsule  Take 100 Units by mouth daily.        Disposition   The patient will be discharged in stable condition to home. Discharge Orders   Future Orders Complete By Expires   Diet - low sodium heart healthy  As directed    Increase activity slowly  As directed    Comments:     Do not drive unless cleared by your cardiologist.  Dr. Antoine Poche wants you to restrict your fluid intake to 48 ounces per day.     Follow-up Information   Follow up with Olga Millers, MD. (Our office will call you for a follow-up appointment. Please call the office if you have not heard from [place] within 3 days.)    Specialty:  Cardiology   Contact information:   [address] [place] [address] [phone]         Duration of Discharge Encounter: Greater than 30 minutes including physician and PA time.  Signed, Ronie Spies PA-C 08/02/2013, 9:21 AM   Patient seen and examined.  Plan as discussed in my rounding note for today and outlined above. [facility]  08/02/2013  12:21 PM

## 2013-08-03 ENCOUNTER — Telehealth: Payer: Self-pay | Admitting: Cardiology

## 2013-08-03 NOTE — Telephone Encounter (Signed)
New problem   7-10 day TCM w/Dr Jens Somrenshaw per after hour voicemail.

## 2013-08-03 NOTE — Telephone Encounter (Signed)
Unable to leave message on home phone and mobil number is incorrect number.

## 2013-08-04 NOTE — Telephone Encounter (Signed)
Patient contacted regarding discharge from [place] on 08-02-13  Patient understands to follow up with provider scott weaver pa on 08/11/13 at 11:10 AM Patient understands discharge instructions? YES Patient understands medications and regiment? YES Patient understands to bring all medications to this visit? YES

## 2013-08-10 ENCOUNTER — Encounter: Payer: Medicare Other | Admitting: Cardiology

## 2013-08-11 ENCOUNTER — Encounter: Payer: Medicare Other | Admitting: Physician Assistant

## 2013-08-25 ENCOUNTER — Encounter: Payer: Self-pay | Admitting: Cardiology

## 2013-08-25 ENCOUNTER — Ambulatory Visit (INDEPENDENT_AMBULATORY_CARE_PROVIDER_SITE_OTHER): Payer: Medicare Other | Admitting: Cardiology

## 2013-08-25 VITALS — BP 110/68 | HR 72 | Ht 60.0 in | Wt 165.0 lb

## 2013-08-25 DIAGNOSIS — I509 Heart failure, unspecified: Secondary | ICD-10-CM

## 2013-08-25 DIAGNOSIS — I5033 Acute on chronic diastolic (congestive) heart failure: Secondary | ICD-10-CM

## 2013-08-25 DIAGNOSIS — I10 Essential (primary) hypertension: Secondary | ICD-10-CM

## 2013-08-25 DIAGNOSIS — I2510 Atherosclerotic heart disease of native coronary artery without angina pectoris: Secondary | ICD-10-CM

## 2013-08-25 DIAGNOSIS — I359 Nonrheumatic aortic valve disorder, unspecified: Secondary | ICD-10-CM

## 2013-08-25 DIAGNOSIS — E785 Hyperlipidemia, unspecified: Secondary | ICD-10-CM

## 2013-08-25 DIAGNOSIS — I350 Nonrheumatic aortic (valve) stenosis: Secondary | ICD-10-CM

## 2013-08-25 MED ORDER — FUROSEMIDE 20 MG PO TABS: 40.0000 mg | ORAL_TABLET | Freq: Every day | ORAL | Status: DC

## 2013-08-25 NOTE — Assessment & Plan Note (Signed)
Patient's aortic stenosis is worsening. This is most likely contributing to her dyspnea and pedal edema. Long discussion today concerning options. She will not consider further procedures given her age. I do not think this is unreasonable. She understands this will progress and take her life. We will try to keep her euvolemic. Change Lasix to 40 mg daily. Check potassium and renal function in one week.

## 2013-08-25 NOTE — Patient Instructions (Signed)
Your physician recommends that you schedule a follow-up appointment in: 8 WEEKS WITH DR CRENSHAW  INCREASE FUROSEMIDE TO 40 MG DAILY=  2 20 MG TABLETS ONCE DAILY  Your physician recommends that you return for lab work in: ONE WEEK

## 2013-08-25 NOTE — Assessment & Plan Note (Signed)
See plan as outlined under aortic stenosis. Increase Lasix to 40 mg daily and follow renal function.

## 2013-08-25 NOTE — Assessment & Plan Note (Signed)
Blood pressure controlled. Continue present medications. 

## 2013-08-25 NOTE — Assessment & Plan Note (Signed)
Continue aspirin and statin. 

## 2013-08-25 NOTE — Assessment & Plan Note (Signed)
Continue statin. 

## 2013-08-25 NOTE — Progress Notes (Signed)
HPI: FU CAD and AS. Cardiac catheterization in December of 2012 revealed minor irregularities in the left main. There is a 25% LAD. There were proximal luminal irregularities in the circumflex. The right coronary artery was noted to be occluded. There was collateralization from the LAD and circumflex. The ejection fraction was 65%. Medical therapy recommended. Admitted in December of 2014 with chest pain. Troponin elevated but patient has chronic elevation of troponin and also occurred in the setting of renal insufficiency. Last echocardiogram in December of 2014 showed normal LV function, apical hypokinesis, grade 1 diastolic dysfunction, severe aortic stenosis with a mean gradient of 38 mm of mercury, moderate left atrial enlargement, mild mitral regurgitation and mild right atrial enlargement. VQ scan very low probability. Given age patient was treated conservatively. ACE inhibitor discontinued. Since discharge she has some dyspnea on exertion. No chest pain. No syncope. Some increasing pedal edema.   Current Outpatient Prescriptions  Medication Sig Dispense Refill  . acetaminophen (TYLENOL ARTHRITIS PAIN) 650 MG CR tablet Take 1,300 mg by mouth 2 (two) times daily. For pain      . albuterol (PROVENTIL HFA;VENTOLIN HFA) 108 (90 BASE) MCG/ACT inhaler Inhale 2 puffs into the lungs daily as needed for wheezing or shortness of breath.       Marland Kitchen. aspirin EC 81 MG tablet Take 1 tablet (81 mg total) by mouth daily.  90 tablet  3  . docusate (COLACE) 50 MG/5ML liquid Take 50 mg by mouth daily.      . fexofenadine (ALLEGRA) 60 MG tablet Take 60 mg by mouth 2 (two) times daily.      . furosemide (LASIX) 20 MG tablet Take 1 tablet (20 mg total) by mouth daily.      Marland Kitchen. levothyroxine (SYNTHROID, LEVOTHROID) 88 MCG tablet Take 88 mcg by mouth daily.        Marland Kitchen. lidocaine (LIDODERM) 5 % Place 1 patch onto the skin daily as needed (pain). Remove & Discard patch within 12 hours or as directed by MD      .  Melatonin 3 MG TABS Take 3 mg by mouth at bedtime.       . polyethylene glycol (MIRALAX / GLYCOLAX) packet Take 17 g by mouth daily as needed (constipation).       . pravastatin (PRAVACHOL) 40 MG tablet Take 40 mg by mouth at bedtime.      . vitamin B-12 (CYANOCOBALAMIN) 1000 MCG tablet Take 1,000 mcg by mouth daily.        . metoprolol (TOPROL-XL) 50 MG 24 hr tablet Take 50 mg by mouth daily at 12 noon.        No current facility-administered medications for this visit.     Past Medical History  Diagnosis Date  . Essential hypertension   . CAD (coronary artery disease) 2005    a. NSTEMI 06/2011, cath showed chronically occluded RCA with good collaterals from the left system and no obstructive disease in the LAD the circumflex, nl LV fxn;  b. 06/2013 elevated troponin in setting of chf, EF 55% w/ apical HK-->Med Rx. c. + troponin 07/2013 - med rx.  . Severe aortic stenosis     a. 06/2013 Echo: EF 55%,  Apical HK, mod LVH, Gr 1 DD, Sev AS (mean 38/peak 64), mild MR, mod dil LA/RA, PASP 32.  Marland Kitchen. Spinal stenosis 2008  . Polio 1931    "made my left arm weak since" (05/01/2012)  . Hypothyroidism   . B12 deficiency anemia   .  Carotid artery stenosis 2005    right  . High cholesterol   . Varicose veins     BLE "since the 1940's" (05/01/2012)  . GERD (gastroesophageal reflux disease)   . Arthritis     "all over my body" (05/01/2012)  . CKD (chronic kidney disease), stage III     Past Surgical History  Procedure Laterality Date  . Joint replacement    . Shoulder debridement    . Total knee arthroplasty  2000  . Thyroidectomy    . Bunionectomy  2004    "and big toe repair; right" (05/01/2012)  . Belpharoptosis repair  10/2007    bilaterally  . Tonsillectomy  1932  . Appendectomy  1940  . Ganglion cyst excision  1955    right hand  . Dilation and curettage of uterus  1969; 1974  . Cholecystectomy  1983  . Knee arthroscopy  1990    left  . Colonoscopy  1988; 1989; 1994; 1999    "all  clear"  . Cataract extraction, bilateral  [id number]  . Total hip arthroplasty  1999    right  . Cardiac catheterization  2006?    History   Social History  . Marital Status: Widowed    Spouse Name: N/A    Number of Children: N/A  . Years of Education: N/A   Occupational History  . Not on file.   Social History Main Topics  . Smoking status: Former Smoker -- 0.50 packs/day for 22 years    Types: Cigarettes    Quit date: 07/30/1962  . Smokeless tobacco: Never Used  . Alcohol Use: 8.4 oz/week    7 Glasses of wine, 7 Shots of liquor per week     Comment: 05/01/2012 "2 drinks/day; wine or liquor" 07/31/13 "occasionally"  . Drug Use: No  . Sexual Activity: No   Other Topics Concern  . Not on file   Social History Narrative  . No narrative on file    ROS: no fevers or chills, productive cough, hemoptysis, dysphasia, odynophagia, melena, hematochezia, dysuria, hematuria, rash, seizure activity, orthopnea, PND, pedal edema, claudication. Remaining systems are negative.  Physical Exam: Well-developed well-nourished in no acute distress.  Skin is warm and dry.  HEENT is normal.  Neck is supple.  Chest is clear to auscultation with normal expansion.  Cardiovascular exam is regular rate and rhythm. 3/6 systolic murmur Abdominal exam nontender or distended. No masses palpated. Extremities show 1+ edema. neuro grossly intact

## 2013-09-01 ENCOUNTER — Other Ambulatory Visit (INDEPENDENT_AMBULATORY_CARE_PROVIDER_SITE_OTHER): Payer: Medicare Other

## 2013-09-01 DIAGNOSIS — I359 Nonrheumatic aortic valve disorder, unspecified: Secondary | ICD-10-CM

## 2013-09-01 LAB — BASIC METABOLIC PANEL
BUN: 12 mg/dL (ref 6–23)
CO2: 28 mEq/L (ref 19–32)
Calcium: 9.1 mg/dL (ref 8.4–10.5)
Chloride: 103 mEq/L (ref 96–112)
Creatinine, Ser: 1 mg/dL (ref 0.4–1.2)
GFR: 57.71 mL/min — ABNORMAL LOW (ref >60.00)
Glucose, Bld: 96 mg/dL (ref 70–99)
POTASSIUM: 3.2 meq/L — AB (ref 3.5–5.1)
Sodium: 138 mEq/L (ref 135–145)

## 2013-09-16 ENCOUNTER — Other Ambulatory Visit: Payer: Self-pay | Admitting: *Deleted

## 2013-09-16 DIAGNOSIS — E876 Hypokalemia: Secondary | ICD-10-CM

## 2013-09-16 MED ORDER — POTASSIUM CHLORIDE CRYS ER 20 MEQ PO TBCR: 20.0000 meq | EXTENDED_RELEASE_TABLET | Freq: Every day | ORAL | Status: DC

## 2013-09-17 ENCOUNTER — Telehealth: Payer: Self-pay | Admitting: Cardiology

## 2013-09-17 NOTE — Telephone Encounter (Signed)
New message  Became hospice patient last week. Hospice nurses needs verbal orders to draw labs, Please call and advise.

## 2013-09-17 NOTE — Telephone Encounter (Signed)
Left message for laura at hospice, the pt will need bmp

## 2013-09-18 ENCOUNTER — Other Ambulatory Visit: Payer: Self-pay | Admitting: Cardiology

## 2013-09-24 ENCOUNTER — Encounter: Payer: Self-pay | Admitting: Cardiology

## 2013-10-20 ENCOUNTER — Ambulatory Visit: Payer: Medicare Other | Admitting: Cardiology

## 2013-11-12 ENCOUNTER — Telehealth: Payer: Self-pay | Admitting: Cardiology

## 2013-11-12 NOTE — Telephone Encounter (Signed)
New message      FYI Ms Barbette MerinoJensen is insistant that she sees Dr Jens Somrenshaw.  She want you to know that she is a hospice patient.  Any labs ordered or new medications ordered---she will be happy to coordinate.  She thinks pt will be here next week.

## 2013-11-12 NOTE — Telephone Encounter (Signed)
Spoke with Sharman Cratemaura, aware she is going to see us on Monday. She wanted us to know she gets her orders from the primary md and if there are any changes to let her know.

## 2013-11-16 ENCOUNTER — Ambulatory Visit (INDEPENDENT_AMBULATORY_CARE_PROVIDER_SITE_OTHER): Admitting: Cardiology

## 2013-11-16 ENCOUNTER — Encounter: Payer: Self-pay | Admitting: Cardiology

## 2013-11-16 VITALS — BP 90/50 | HR 61 | Ht 60.0 in | Wt 159.0 lb

## 2013-11-16 DIAGNOSIS — I2510 Atherosclerotic heart disease of native coronary artery without angina pectoris: Secondary | ICD-10-CM

## 2013-11-16 DIAGNOSIS — I5033 Acute on chronic diastolic (congestive) heart failure: Secondary | ICD-10-CM

## 2013-11-16 DIAGNOSIS — I10 Essential (primary) hypertension: Secondary | ICD-10-CM

## 2013-11-16 DIAGNOSIS — I509 Heart failure, unspecified: Secondary | ICD-10-CM

## 2013-11-16 DIAGNOSIS — E785 Hyperlipidemia, unspecified: Secondary | ICD-10-CM

## 2013-11-16 LAB — BASIC METABOLIC PANEL
BUN: 29 mg/dL — AB (ref 6–23)
CHLORIDE: 102 meq/L (ref 96–112)
CO2: 30 meq/L (ref 19–32)
Calcium: 9.1 mg/dL (ref 8.4–10.5)
Creatinine, Ser: 1 mg/dL (ref 0.4–1.2)
GFR: 55.03 mL/min — ABNORMAL LOW (ref >60.00)
Glucose, Bld: 89 mg/dL (ref 70–99)
POTASSIUM: 3.3 meq/L — AB (ref 3.5–5.1)
SODIUM: 139 meq/L (ref 135–145)

## 2013-11-16 MED ORDER — METOPROLOL SUCCINATE ER 25 MG PO TB24: 25.0000 mg | ORAL_TABLET | Freq: Every day | ORAL | Status: DC

## 2013-11-16 NOTE — Progress Notes (Signed)
HPI: FU CAD and AS. Cardiac catheterization in December of 2012 revealed minor irregularities in the left main. There is a 25% LAD. There were proximal luminal irregularities in the circumflex. The right coronary artery was noted to be occluded. There was collateralization from the LAD and circumflex. The ejection fraction was 65%. Medical therapy recommended. Admitted in December of 2014 with chest pain. Troponin elevated but patient has chronic elevation of troponin and also occurred in the setting of renal insufficiency. Last echocardiogram in December of 2014 showed normal LV function, apical hypokinesis, grade 1 diastolic dysfunction, severe aortic stenosis with a mean gradient of 38 mm of mercury, moderate left atrial enlargement, mild mitral regurgitation and mild right atrial enlargement. VQ scan very low probability. Given age and at her request patient was treated conservatively. ACE inhibitor discontinued. Since last seen in Jan 2015, She is now on home oxygen. She has mild dyspnea. Occasional chest heaviness. Her pedal edema has improved. She is now on hospice.   Current Outpatient Prescriptions  Medication Sig Dispense Refill  . acetaminophen (TYLENOL ARTHRITIS PAIN) 650 MG CR tablet Take 1,300 mg by mouth 2 (two) times daily. For pain      . albuterol (PROVENTIL HFA;VENTOLIN HFA) 108 (90 BASE) MCG/ACT inhaler Inhale 2 puffs into the lungs daily as needed for wheezing or shortness of breath.       Marland Kitchen. aspirin EC 81 MG tablet Take 1 tablet (81 mg total) by mouth daily.  90 tablet  3  . docusate (COLACE) 50 MG/5ML liquid Take 50 mg by mouth 2 (two) times daily.       . fexofenadine (ALLEGRA) 60 MG tablet Take 60 mg by mouth as needed. OTC      . FOLIC ACID PO Take by mouth daily.      . furosemide (LASIX) 20 MG tablet Take 2 tablets (40 mg total) by mouth daily.  180 tablet  3  . levothyroxine (SYNTHROID, LEVOTHROID) 88 MCG tablet Take 88 mcg by mouth daily.        Marland Kitchen. lidocaine  (LIDODERM) 5 % Place 1 patch onto the skin daily as needed (pain). Remove & Discard patch within 12 hours or as directed by MD      . Melatonin 3 MG TABS Take 3 mg by mouth at bedtime.       . metoprolol (TOPROL-XL) 50 MG 24 hr tablet Take 50 mg by mouth daily at 12 noon.       . Misc Natural Products (OSTEO BI-FLEX JOINT SHIELD PO) Take by mouth daily.      . Multiple Vitamins-Minerals (CENTRUM SILVER PO) Take by mouth daily.      . polyethylene glycol (MIRALAX / GLYCOLAX) packet Take 17 g by mouth daily as needed (constipation).       . potassium chloride SA (K-DUR,KLOR-CON) 20 MEQ tablet Take 1 tablet (20 mEq total) by mouth daily.  90 tablet  3  . pravastatin (PRAVACHOL) 40 MG tablet TAKE 1 TABLET EVERY EVENING  90 tablet  1  . vitamin B-12 (CYANOCOBALAMIN) 1000 MCG tablet Take 1,000 mcg by mouth daily.         No current facility-administered medications for this visit.     Past Medical History  Diagnosis Date  . Essential hypertension   . CAD (coronary artery disease) 2005    a. NSTEMI 06/2011, cath showed chronically occluded RCA with good collaterals from the left system and no obstructive disease in the LAD the  circumflex, nl LV fxn;  b. 06/2013 elevated troponin in setting of chf, EF 55% w/ apical HK-->Med Rx. c. + troponin 07/2013 - med rx.  . Severe aortic stenosis     a. 06/2013 Echo: EF 55%,  Apical HK, mod LVH, Gr 1 DD, Sev AS (mean 38/peak 64), mild MR, mod dil LA/RA, PASP 32.  Marland Kitchen. Spinal stenosis 2008  . Polio 1931    "made my left arm weak since" (05/01/2012)  . Hypothyroidism   . B12 deficiency anemia   . Carotid artery stenosis 2005    right  . High cholesterol   . Varicose veins     BLE "since the 1940's" (05/01/2012)  . GERD (gastroesophageal reflux disease)   . Arthritis     "all over my body" (05/01/2012)  . CKD (chronic kidney disease), stage III     Past Surgical History  Procedure Laterality Date  . Joint replacement    . Shoulder debridement    . Total  knee arthroplasty  2000  . Thyroidectomy    . Bunionectomy  2004    "and big toe repair; right" (05/01/2012)  . Belpharoptosis repair  10/2007    bilaterally  . Tonsillectomy  1932  . Appendectomy  1940  . Ganglion cyst excision  1955    right hand  . Dilation and curettage of uterus  1969; 1974  . Cholecystectomy  1983  . Knee arthroscopy  1990    left  . Colonoscopy  1988; 1989; 1994; 1999    "all clear"  . Cataract extraction, bilateral  [id number]  . Total hip arthroplasty  1999    right  . Cardiac catheterization  2006?    History   Social History  . Marital Status: Widowed    Spouse Name: N/A    Number of Children: N/A  . Years of Education: N/A   Occupational History  . Not on file.   Social History Main Topics  . Smoking status: Former Smoker -- 0.50 packs/day for 22 years    Types: Cigarettes    Quit date: 07/30/1962  . Smokeless tobacco: Never Used  . Alcohol Use: 8.4 oz/week    7 Glasses of wine, 7 Shots of liquor per week     Comment: 05/01/2012 "2 drinks/day; wine or liquor" 07/31/13 "occasionally"  . Drug Use: No  . Sexual Activity: No   Other Topics Concern  . Not on file   Social History Narrative  . No narrative on file    ROS: no fevers or chills, productive cough, hemoptysis, dysphasia, odynophagia, melena, hematochezia, dysuria, hematuria, rash, seizure activity, orthopnea, PND, claudication. Remaining systems are negative.  Physical Exam: Well-developed well-nourished in no acute distress.  Skin is warm and dry.  HEENT is normal.  Neck is supple.  Chest With mild basilar crackles Cardiovascular exam is regular rate and rhythm. 3/6 systolic murmur left sternal border Abdominal exam nontender or distended. No masses palpated. Extremities show 1+ edema. neuro grossly intact

## 2013-11-16 NOTE — Assessment & Plan Note (Signed)
Continue aspirin and statin. 

## 2013-11-16 NOTE — Assessment & Plan Note (Addendum)
Patient does not aggressive measures.Continue present dose of Lasix. Check potassium and renal function. She understands that this will progress and eventually take her life.

## 2013-11-16 NOTE — Assessment & Plan Note (Signed)
Continue present dose of Lasix. 

## 2013-11-16 NOTE — Assessment & Plan Note (Signed)
Blood pressure is borderline.Change Toprol to 25 mg daily.

## 2013-11-16 NOTE — Assessment & Plan Note (Signed)
Continue statin. 

## 2013-11-16 NOTE — Patient Instructions (Signed)
Your physician wants you to follow-up in: 3 MONTHS WITH DR Jens SomRENSHAW You will receive a reminder letter in the mail two months in advance. If you don't receive a letter, please call our office to schedule the follow-up appointment.   Your physician recommends that you HAVE LAB WORK TODAY  CHANGE METOPROLOL TO 25 MG ONCE DAILY

## 2013-11-18 ENCOUNTER — Encounter: Payer: Self-pay | Admitting: Cardiology

## 2013-11-18 NOTE — Telephone Encounter (Signed)
This encounter was created in error - please disregard.

## 2013-11-18 NOTE — Telephone Encounter (Signed)
New message     Returning Holly Gibson's call to get lab results

## 2013-11-26 ENCOUNTER — Encounter: Payer: Self-pay | Admitting: Cardiology

## 2013-11-26 ENCOUNTER — Telehealth: Payer: Self-pay | Admitting: Cardiology

## 2013-11-26 NOTE — Telephone Encounter (Signed)
°  Patient was in the office last week. Was given instructions to double potassium then draw a metabolic panel. Hospice discovered today that potassium had not been doubled as instructed. Would you like a call tomorrow w/potassium results or wait a week once potassium has been doubled? Please call and advise.

## 2013-11-26 NOTE — Telephone Encounter (Signed)
Follow up     Talk to a nurse regarding dosage for potassium.  There is confusion regarding the dosage. Please call as soon as you can---everyone is calling about this

## 2013-11-26 NOTE — Telephone Encounter (Signed)
Spoke with pt dtr, she looked at the wrong bottle. The pt is currently taking 20 meq of potassium. Spoke with Sharman Cratemaura, she is going to draw bmp today and then a decision will be made.

## 2013-11-27 ENCOUNTER — Telehealth: Payer: Self-pay | Admitting: Cardiology

## 2013-11-27 NOTE — Telephone Encounter (Signed)
Following up   Per Sharman CrateMaura RN w/[facility].  Lab results were faxed in today and pt's  Potasium is with in normal limits 4.0 .   If any questions please call her.

## 2013-11-27 NOTE — Telephone Encounter (Signed)
Dr. SwazilandJordan (DOD) has reviewed & recommended pt stay on Potassium 40meq daily I have called Maura ( hospice nurse) & she is aware  Mylo Redebbie Mehmet Scally RN

## 2013-11-30 ENCOUNTER — Telehealth: Payer: Self-pay | Admitting: Cardiology

## 2013-11-30 NOTE — Telephone Encounter (Signed)
New message     Does any adjustment to K+ need to be made . Patient on 20 mg once a day K+.

## 2013-11-30 NOTE — Telephone Encounter (Signed)
Left message for Holly Gibson, pt is to cont what she was taking when the lab work was drawn. She is to call back with questions.

## 2013-12-08 ENCOUNTER — Ambulatory Visit: Admitting: Cardiology

## 2013-12-29 ENCOUNTER — Ambulatory Visit: Payer: Medicare Other | Admitting: Cardiology

## 2014-02-16 ENCOUNTER — Telehealth: Payer: Self-pay | Admitting: Cardiology

## 2014-02-16 NOTE — Telephone Encounter (Signed)
Left message for Holly Gibson, aware we will see the pt Thursday this week. If there are concerns we need to address at the time of appt, I ask her to call back. If not will update her after office visit.

## 2014-02-16 NOTE — Telephone Encounter (Signed)
New message     Want you to know that the patient's disease is progressing worse.  She will see Dr Jens Somrenshaw soon.  The hospice nurse has put in an order to ask the attending phy if patient can stop taking large pills since she cannot swollow large pills

## 2014-02-16 NOTE — Telephone Encounter (Signed)
Spoke with pt, Holly Gibson, the pt is getting weaker and more dyspneic. They are also concerned about the size of her potassium, okay given for pt to dissolve potassium in water to be able to swallow.  They are also going to start stopping her vit and other supplements she does not need. They are now unable to get a pulse ox and are not able to always hear her bp. Will contact them if any changes are made at office visit.

## 2014-02-16 NOTE — Telephone Encounter (Signed)
Returning your call. °

## 2014-02-17 ENCOUNTER — Encounter: Payer: Self-pay | Admitting: Cardiology

## 2014-02-17 NOTE — Telephone Encounter (Signed)
This encounter was created in error - please disregard.

## 2014-02-17 NOTE — Telephone Encounter (Signed)
Deferred to Debra Mathis 

## 2014-02-17 NOTE — Telephone Encounter (Signed)
Please call,tshe wants to talk to you.She says the pt has an appt tomorrow.

## 2014-02-18 ENCOUNTER — Ambulatory Visit (INDEPENDENT_AMBULATORY_CARE_PROVIDER_SITE_OTHER): Admitting: Cardiology

## 2014-02-18 ENCOUNTER — Encounter: Payer: Self-pay | Admitting: Cardiology

## 2014-02-18 VITALS — BP 110/66 | HR 82 | Ht 60.0 in | Wt 161.0 lb

## 2014-02-18 DIAGNOSIS — I350 Nonrheumatic aortic (valve) stenosis: Secondary | ICD-10-CM

## 2014-02-18 DIAGNOSIS — I2510 Atherosclerotic heart disease of native coronary artery without angina pectoris: Secondary | ICD-10-CM

## 2014-02-18 DIAGNOSIS — I10 Essential (primary) hypertension: Secondary | ICD-10-CM

## 2014-02-18 DIAGNOSIS — I359 Nonrheumatic aortic valve disorder, unspecified: Secondary | ICD-10-CM

## 2014-02-18 MED ORDER — TORSEMIDE 20 MG PO TABS: ORAL_TABLET | ORAL | Status: AC

## 2014-02-18 MED ORDER — FUROSEMIDE 20 MG PO TABS: ORAL_TABLET | ORAL | Status: DC

## 2014-02-18 MED ORDER — POTASSIUM CHLORIDE 20 MEQ/15ML (10%) PO LIQD: 20.0000 meq | Freq: Every day | ORAL | Status: AC

## 2014-02-18 NOTE — Assessment & Plan Note (Signed)
Continue aspirin 

## 2014-02-18 NOTE — Assessment & Plan Note (Signed)
Discontinued Toprol.

## 2014-02-18 NOTE — Progress Notes (Signed)
HPI: FU CAD and AS. Cardiac catheterization in December of 2012 revealed minor irregularities in the left main. There is a 25% LAD. There were proximal luminal irregularities in the circumflex. The right coronary artery was noted to be occluded. There was collateralization from the LAD and circumflex. The ejection fraction was 65%. Medical therapy recommended. Admitted in December of 2014 with chest pain. Troponin elevated but patient has chronic elevation of troponin and also occurred in the setting of renal insufficiency. Last echocardiogram in December of 2014 showed normal LV function, apical hypokinesis, grade 1 diastolic dysfunction, severe aortic stenosis with a mean gradient of 38 mm of mercury, moderate left atrial enlargement, mild mitral regurgitation and mild right atrial enlargement. VQ scan very low probability. Given age and at her request patient was treated conservatively. She is now on home oxygen. She is now on hospice. Since last seen in April 2015, She denies dyspnea, chest pain or syncope. She has worsening pedal edema.   Current Outpatient Prescriptions  Medication Sig Dispense Refill  . acetaminophen (TYLENOL ARTHRITIS PAIN) 650 MG CR tablet Take 1,300 mg by mouth 2 (two) times daily. For pain      . albuterol (PROVENTIL HFA;VENTOLIN HFA) 108 (90 BASE) MCG/ACT inhaler Inhale 2 puffs into the lungs daily as needed for wheezing or shortness of breath.       Marland Kitchen aspirin EC 81 MG tablet Take 1 tablet (81 mg total) by mouth daily.  90 tablet  3  . docusate (COLACE) 50 MG/5ML liquid Take 50 mg by mouth 2 (two) times daily.       . fexofenadine (ALLEGRA) 60 MG tablet Take 60 mg by mouth as needed. OTC      . levothyroxine (SYNTHROID, LEVOTHROID) 88 MCG tablet Take 88 mcg by mouth daily.        Marland Kitchen lidocaine (LIDODERM) 5 % Place 1 patch onto the skin daily as needed (pain). Remove & Discard patch within 12 hours or as directed by MD      . metoprolol succinate (TOPROL-XL) 25 MG 24  hr tablet Take 1 tablet (25 mg total) by mouth daily at 12 noon.  30 tablet  12  . potassium chloride SA (K-DUR,KLOR-CON) 20 MEQ tablet Take 1 tablet (20 mEq total) by mouth daily.  90 tablet  3  . torsemide (DEMADEX) 20 MG tablet Take 20 mg by mouth daily.       No current facility-administered medications for this visit.     Past Medical History  Diagnosis Date  . Essential hypertension   . CAD (coronary artery disease) 2005    a. NSTEMI 06/2011, cath showed chronically occluded RCA with good collaterals from the left system and no obstructive disease in the LAD the circumflex, nl LV fxn;  b. 06/2013 elevated troponin in setting of chf, EF 55% w/ apical HK-->Med Rx. c. + troponin 07/2013 - med rx.  . Severe aortic stenosis     a. 06/2013 Echo: EF 55%,  Apical HK, mod LVH, Gr 1 DD, Sev AS (mean 38/peak 64), mild MR, mod dil LA/RA, PASP 32.  Marland Kitchen Spinal stenosis 2008  . Polio 1931    "made my left arm weak since" (05/01/2012)  . Hypothyroidism   . B12 deficiency anemia   . Carotid artery stenosis 2005    right  . High cholesterol   . Varicose veins     BLE "since the 1940's" (05/01/2012)  . GERD (gastroesophageal reflux disease)   .  Arthritis     "all over my body" (05/01/2012)  . CKD (chronic kidney disease), stage III     Past Surgical History  Procedure Laterality Date  . Joint replacement    . Shoulder debridement    . Total knee arthroplasty  2000  . Thyroidectomy    . Bunionectomy  2004    "and big toe repair; right" (05/01/2012)  . Belpharoptosis repair  10/2007    bilaterally  . Tonsillectomy  1932  . Appendectomy  1940  . Ganglion cyst excision  1955    right hand  . Dilation and curettage of uterus  1969; 1974  . Cholecystectomy  1983  . Knee arthroscopy  1990    left  . Colonoscopy  1988; 1989; 1994; 1999    "all clear"  . Cataract extraction, bilateral  [id number]  . Total hip arthroplasty  1999    right  . Cardiac catheterization  2006?    History    Social History  . Marital Status: Widowed    Spouse Name: N/A    Number of Children: N/A  . Years of Education: N/A   Occupational History  . Not on file.   Social History Main Topics  . Smoking status: Former Smoker -- 0.50 packs/day for 22 years    Types: Cigarettes    Quit date: 07/30/1962  . Smokeless tobacco: Never Used  . Alcohol Use: 8.4 oz/week    7 Glasses of wine, 7 Shots of liquor per week     Comment: 05/01/2012 "2 drinks/day; wine or liquor" 07/31/13 "occasionally"  . Drug Use: No  . Sexual Activity: No   Other Topics Concern  . Not on file   Social History Narrative  . No narrative on file    ROS: no fevers or chills, productive cough, hemoptysis, dysphasia, odynophagia, melena, hematochezia, dysuria, hematuria, rash, seizure activity, orthopnea, PND, pedal edema, claudication. Remaining systems are negative.  Physical Exam: Well-developed frail in no acute distress.  Skin is warm and dry.  HEENT is normal.  Neck is supple.  Chest is clear to auscultation with normal expansion.  Cardiovascular exam is regular rate and rhythm. 3/6 systolic murmur Abdominal exam nontender or distended. No masses palpated. Extremities show 2-3+ edema. neuro grossly intact  ECG Sinus rhythm at a rate of 82. Poor R wave progression and prior anterior infarct cannot be excluded.

## 2014-02-18 NOTE — Assessment & Plan Note (Signed)
Continue present dose of Demadex with additional 20 mg daily as needed.

## 2014-02-18 NOTE — Patient Instructions (Addendum)
Your physician recommends that you schedule a follow-up appointment in: AS NEEDED  TAKE 20 MG OF TORSEMIDE ONCE DAILY AND AS NEEDED FOR SWELLING OR SHORTNESS OF BREATH  STOP METOPROLOL  CHANGE POTASSIUM TO LIQUID

## 2014-02-18 NOTE — Assessment & Plan Note (Signed)
Patient does not aggressive measures. She is volume overloaded today. She is on hospice. She understands that she is nearing end-of-life. Continue present dose of Demadex. Take an additional 20 mg daily as needed. We will not draw blood work.

## 2014-03-30 DEATH — deceased

## 2014-07-07 ENCOUNTER — Encounter (HOSPITAL_COMMUNITY): Payer: Self-pay | Admitting: Cardiology
